# Patient Record
Sex: Female | Born: 1975 | Race: Black or African American | Hispanic: No | Marital: Single | Smoking: Never smoker
Health system: Southern US, Community
[De-identification: ages and names within clinical notes are randomized; demographics above are authoritative.]

## PROBLEM LIST (undated history)

## (undated) DIAGNOSIS — Z86718 Personal history of other venous thrombosis and embolism: Secondary | ICD-10-CM

## (undated) DIAGNOSIS — Z832 Family history of diseases of the blood and blood-forming organs and certain disorders involving the immune mechanism: Secondary | ICD-10-CM

## (undated) DIAGNOSIS — I10 Essential (primary) hypertension: Secondary | ICD-10-CM

## (undated) DIAGNOSIS — M179 Osteoarthritis of knee, unspecified: Secondary | ICD-10-CM

## (undated) DIAGNOSIS — J45909 Unspecified asthma, uncomplicated: Secondary | ICD-10-CM

## (undated) HISTORY — DX: Personal history of other venous thrombosis and embolism: Z86.718

## (undated) HISTORY — DX: Unspecified asthma, uncomplicated: J45.909

## (undated) HISTORY — DX: Essential (primary) hypertension: I10

## (undated) HISTORY — DX: Family history of diseases of the blood and blood-forming organs and certain disorders involving the immune mechanism: Z83.2

## (undated) HISTORY — DX: Osteoarthritis of knee, unspecified: M17.9

---

## 2000-07-24 ENCOUNTER — Encounter: Payer: Self-pay | Admitting: Emergency Medicine

## 2000-07-24 ENCOUNTER — Encounter: Admission: RE | Admit: 2000-07-24 | Discharge: 2000-07-24 | Payer: Self-pay | Admitting: Emergency Medicine

## 2000-08-02 ENCOUNTER — Other Ambulatory Visit: Admission: RE | Admit: 2000-08-02 | Discharge: 2000-08-02 | Payer: Self-pay | Admitting: Gynecology

## 2000-08-13 ENCOUNTER — Inpatient Hospital Stay (HOSPITAL_COMMUNITY): Admission: AD | Admit: 2000-08-13 | Discharge: 2000-08-13 | Payer: Self-pay | Admitting: Gynecology

## 2000-08-14 ENCOUNTER — Ambulatory Visit (HOSPITAL_COMMUNITY): Admission: RE | Admit: 2000-08-14 | Discharge: 2000-08-14 | Payer: Self-pay | Admitting: Gynecology

## 2011-09-13 HISTORY — PX: DILATION AND CURETTAGE OF UTERUS: SHX78

## 2011-09-13 HISTORY — PX: HYSTEROSCOPY: SHX211

## 2014-09-12 HISTORY — PX: OTHER SURGICAL HISTORY: SHX169

## 2015-09-13 DIAGNOSIS — I471 Supraventricular tachycardia, unspecified: Secondary | ICD-10-CM

## 2015-09-13 HISTORY — DX: Supraventricular tachycardia: I47.1

## 2015-09-13 HISTORY — DX: Supraventricular tachycardia, unspecified: I47.10

## 2016-08-11 DIAGNOSIS — I471 Supraventricular tachycardia, unspecified: Secondary | ICD-10-CM | POA: Insufficient documentation

## 2018-07-17 LAB — HM PAP SMEAR: HM Pap smear: NORMAL

## 2021-01-04 DIAGNOSIS — Z832 Family history of diseases of the blood and blood-forming organs and certain disorders involving the immune mechanism: Secondary | ICD-10-CM | POA: Insufficient documentation

## 2021-01-04 DIAGNOSIS — R7303 Prediabetes: Secondary | ICD-10-CM | POA: Insufficient documentation

## 2021-01-04 DIAGNOSIS — Z8616 Personal history of COVID-19: Secondary | ICD-10-CM | POA: Insufficient documentation

## 2021-01-04 DIAGNOSIS — Z86718 Personal history of other venous thrombosis and embolism: Secondary | ICD-10-CM | POA: Insufficient documentation

## 2021-06-22 ENCOUNTER — Telehealth: Payer: Self-pay | Admitting: Internal Medicine

## 2021-06-22 NOTE — Telephone Encounter (Signed)
Paitent asking for short supplyo f PRADAXA until appt on 09/16/21.[facility], [place], [place] [address]. [phone]

## 2021-06-24 NOTE — Telephone Encounter (Signed)
I called and left a detail message on the patient vm that we do not send over any prescription for anyone prior to establishing care. If she need this prescription before her visit she would need to be seen at a Urgent Care or Acute Care.

## 2021-06-24 NOTE — Telephone Encounter (Signed)
This is not my patient has never been seen in this clinic.  I understand she has an upcoming appointment however I do not feel comfortable refilling her medications until I meet her.

## 2021-07-12 ENCOUNTER — Telehealth: Payer: Self-pay | Admitting: Internal Medicine

## 2021-07-12 NOTE — Telephone Encounter (Signed)
Patient called and advised per note below, she verbalized understanding and says he spoke to her old doctor and was told that she has one more refill on that medication, so take it to the pharmacy and see if they will refill with the new insurance. She says they told her to call them back if unable to refill and she says she guess they will figure something out.   June 24, 2021 Donnie Mesa L, CMA     11:02 AM Note I called and left a detail message on the patient vm that we do not send over any prescription for anyone prior to establishing care. If she need this prescription before her visit she would need to be seen at a Urgent Care or Acute Care.     Jearld Fenton, NP to Wilson Singer, CMA     6:09 AM Note This is not my patient has never been seen in this clinic.  I understand she has an upcoming appointment however I do not feel comfortable refilling her medications until I meet her.

## 2021-07-12 NOTE — Telephone Encounter (Signed)
Copied from [place] [phone]. Topic: General - Other >> Jul 12, 2021  1:02 PM Leward Quan A wrote: Reason for CRM: Patient called in to get a refill on her medication but wont be established until her appointment in January needing to know how she can get a refill. Please advise can be reached at Ph#   [phone]

## 2021-09-16 ENCOUNTER — Encounter: Payer: Self-pay | Admitting: Internal Medicine

## 2021-09-16 ENCOUNTER — Other Ambulatory Visit: Payer: Self-pay

## 2021-09-16 ENCOUNTER — Ambulatory Visit: Payer: BC Managed Care – PPO | Admitting: Internal Medicine

## 2021-09-16 VITALS — BP 138/70 | HR 91 | Temp 98.2°F | Resp 17 | Ht 67.0 in | Wt 289.6 lb

## 2021-09-16 DIAGNOSIS — M25562 Pain in left knee: Secondary | ICD-10-CM | POA: Diagnosis not present

## 2021-09-16 DIAGNOSIS — J4540 Moderate persistent asthma, uncomplicated: Secondary | ICD-10-CM

## 2021-09-16 DIAGNOSIS — J45909 Unspecified asthma, uncomplicated: Secondary | ICD-10-CM | POA: Insufficient documentation

## 2021-09-16 DIAGNOSIS — Z86718 Personal history of other venous thrombosis and embolism: Secondary | ICD-10-CM | POA: Insufficient documentation

## 2021-09-16 DIAGNOSIS — G8929 Other chronic pain: Secondary | ICD-10-CM

## 2021-09-16 DIAGNOSIS — I10 Essential (primary) hypertension: Secondary | ICD-10-CM | POA: Diagnosis not present

## 2021-09-16 MED ORDER — DABIGATRAN ETEXILATE MESYLATE 150 MG PO CAPS: 150.0000 mg | ORAL_CAPSULE | Freq: Two times a day (BID) | ORAL | 0 refills | Status: DC

## 2021-09-16 NOTE — Assessment & Plan Note (Signed)
Encourage diet and exercise for weight loss 

## 2021-09-16 NOTE — Progress Notes (Signed)
HPI  Pt presents to the clinic today for to establish care and for management of the conditions listed below.  Asthma: Moderate, persistent. Managed on Ciclesnide and Albuterol. There are no PFT's on file.   HTN: Her BP today is 144/76. She is taking Lisinopril HCT as prescribed. There is no ECG on file.  Hx of Left DVT, Family History of Clotting Disorder: She is on Pradaxa currently. She needs to set up with a hematologist in this area.  She also reports chronic left knee pain. This started 2 years ago. She describes the pain as pinching, weakness. She numbness or tingling of the LLE. The pain seems worse when she is walking on concrete floors. She thinks she may have a had a injury to the left knee in HS but no prior surgery. She takes Tylenol Arthritis with some relief of symptoms. Xray from care everywhere from 05/2020 reviewed.  History reviewed. No pertinent past medical history.  Current Outpatient Medications  Medication Sig Dispense Refill   albuterol (VENTOLIN HFA) 108 (90 Base) MCG/ACT inhaler Shake well and inhale 2 puffs by mouth every 4 hours as needed for shortness of breath, cough or wheezing     ciclesonide (ALVESCO) 80 MCG/ACT inhaler Inhale 1 puff into the lungs 2 (two) times daily.     dabigatran (PRADAXA) 150 MG CAPS capsule Take 150 mg by mouth 2 (two) times daily.     fluticasone (FLONASE) 50 MCG/ACT nasal spray Place 2 sprays into the nose daily.     lisinopril-hydrochlorothiazide (ZESTORETIC) 20-25 MG tablet Take one-half tablet by mouth daily for 1 week. Only increase to 1 tablet daily if blood pressure higher than 140/90.     No current facility-administered medications for this visit.    Allergies  Allergen Reactions   Penicillins Shortness Of Breath    Hives and vomitting    History reviewed. No pertinent family history.  Social History   Socioeconomic History   Marital status: Single    Spouse name: Not on file   Number of children: Not on file    Years of education: Not on file   Highest education level: Not on file  Occupational History   Not on file  Tobacco Use   Smoking status: Never   Smokeless tobacco: Never  Vaping Use   Vaping Use: Never used  Substance and Sexual Activity   Alcohol use: Yes    Alcohol/week: 7.0 standard drinks    Types: 7 Standard drinks or equivalent per week   Drug use: Never   Sexual activity: Not on file  Other Topics Concern   Not on file  Social History Narrative   Not on file   Social Determinants of Health   Financial Resource Strain: Not on file  Food Insecurity: Not on file  Transportation Needs: Not on file  Physical Activity: Not on file  Stress: Not on file  Social Connections: Not on file  Intimate Partner Violence: Not on file    ROS:  Constitutional: Denies fever, malaise, fatigue, headache or abrupt weight changes.  HEENT: Denies eye pain, eye redness, ear pain, ringing in the ears, wax buildup, runny nose, nasal congestion, bloody nose, or sore throat. Respiratory: Denies difficulty breathing, shortness of breath, cough or sputum production.   Cardiovascular: Denies chest pain, chest tightness, palpitations or swelling in the hands or feet.  Gastrointestinal: Denies abdominal pain, bloating, constipation, diarrhea or blood in the stool.  GU: Denies frequency, urgency, pain with urination, blood in urine, odor  or discharge. Musculoskeletal: Pt reports left knee pain. Denies decrease in range of motion, difficulty with gait, muscle pain or joint swelling.  Skin: Denies redness, rashes, lesions or ulcercations.  Neurological: Denies dizziness, difficulty with memory, difficulty with speech or problems with balance and coordination.  Psych: Denies anxiety, depression, SI/HI.  No other specific complaints in a complete review of systems (except as listed in HPI above).  PE:  BP (!) 144/76 (BP Location: Right Arm, Patient Position: Sitting, Cuff Size: Large)    Pulse 91     Temp 98.2 F (36.8 C) (Temporal)    Resp 17    Ht 5\' 7"  (1.702 m)    Wt 289 lb 9.6 oz (131.4 kg)    LMP 08/27/2021    SpO2 100%    BMI 45.36 kg/m  Wt Readings from Last 3 Encounters:  09/16/21 289 lb 9.6 oz (131.4 kg)    General: Appears her stated age, obese, in NAD. HEENT: Head: normal shape and size; Eyes: sclera white and EOMs intact;  Cardiovascular: Normal rate and rhythm. S1,S2 noted.  No murmur, rubs or gallops noted. No JVD or BLE edema. No carotid bruits noted. Pulmonary/Chest: Normal effort and positive vesicular breath sounds. No respiratory distress. No wheezes, rales or ronchi noted.  Musculoskeletal: Normal flexion and extension of left knee.  No pain with palpation of the left knee.  No signs of joint swelling. No difficulty with gait.  Neurological: Alert and oriented. Cranial nerves II-XII grossly intact. Coordination normal.  Psychiatric: Mood and affect normal. Behavior is normal. Judgment and thought content normal.      Assessment and Plan:   Webb Silversmith, NP This visit occurred during the SARS-CoV-2 public health emergency.  Safety protocols were in place, including screening questions prior to the visit, additional usage of staff PPE, and extensive cleaning of exam room while observing appropriate contact time as indicated for disinfecting solutions.

## 2021-09-16 NOTE — Assessment & Plan Note (Signed)
Controlled on Lisinopril HCT Reinforced DASH diet and exercise for weight loss

## 2021-09-16 NOTE — Assessment & Plan Note (Signed)
X-ray reviewed Encourage exercise for weight loss Continue Tylenol arthritis as needed She declines referral to orthopedics at this time

## 2021-09-16 NOTE — Assessment & Plan Note (Signed)
Currently on Pradaxa, refilled today Referral to hematology given family history of clotting disorder

## 2021-09-16 NOTE — Assessment & Plan Note (Signed)
Continue inhalers as previously prescribed She declines referral to asthma and allergy specialist at this time

## 2021-09-16 NOTE — Patient Instructions (Signed)
Knee Exercises Ask your health care provider which exercises are safe for you. Do exercises exactly as told by your health care provider and adjust them as directed. It is normal to feel mild stretching, pulling, tightness, or discomfort as you do these exercises. Stop right away if you feel sudden pain or your pain gets worse. Do not begin these exercises until told by your health care provider. Stretching and range-of-motion exercises These exercises warm up your muscles and joints and improve the movement and flexibility of your knee. These exercises also help to relieve pain and swelling. Knee extension, prone  Lie on your abdomen (prone position) on a bed. Place your left / right knee just beyond the edge of the surface so your knee is not on the bed. You can put a towel under your left / right thigh just above your kneecap for comfort. Relax your leg muscles and allow gravity to straighten your knee (extension). You should feel a stretch behind your left / right knee. Hold this position for __________ seconds. Scoot up so your knee is supported between repetitions. Repeat __________ times. Complete this exercise __________ times a day. Knee flexion, active  Lie on your back with both legs straight. If this causes back discomfort, bend your left / right knee so your foot is flat on the floor. Slowly slide your left / right heel back toward your buttocks. Stop when you feel a gentle stretch in the front of your knee or thigh (flexion). Hold this position for __________ seconds. Slowly slide your left / right heel back to the starting position. Repeat __________ times. Complete this exercise __________ times a day. Quadriceps stretch, prone  Lie on your abdomen on a firm surface, such as a bed or padded floor. Bend your left / right knee and hold your ankle. If you cannot reach your ankle or pant leg, loop a belt around your foot and grab the belt instead. Gently pull your heel toward your  buttocks. Your knee should not slide out to the side. You should feel a stretch in the front of your thigh and knee (quadriceps). Hold this position for __________ seconds. Repeat __________ times. Complete this exercise __________ times a day. Hamstring, supine  Lie on your back (supine position). Loop a belt or towel over the ball of your left / right foot. The ball of your foot is on the walking surface, right under your toes. Straighten your left / right knee and slowly pull on the belt to raise your leg until you feel a gentle stretch behind your knee (hamstring). Do not let your knee bend while you do this. Keep your other leg flat on the floor. Hold this position for __________ seconds. Repeat __________ times. Complete this exercise __________ times a day. Strengthening exercises These exercises build strength and endurance in your knee. Endurance is the ability to use your muscles for a long time, even after they get tired. Quadriceps, isometric This exercise strengthens the muscles in front of your thigh (quadriceps) without moving your knee joint (isometric). Lie on your back with your left / right leg extended and your other knee bent. Put a rolled towel or small pillow under your knee if told by your health care provider. Slowly tense the muscles in the front of your left / right thigh. You should see your kneecap slide up toward your hip or see increased dimpling just above the knee. This motion will push the back of the knee toward the floor.   For __________ seconds, hold the muscle as tight as you can without increasing your pain. Relax the muscles slowly and completely. Repeat __________ times. Complete this exercise __________ times a day. Straight leg raises This exercise strengthens the muscles in front of your thigh (quadriceps) and the muscles that move your hips (hip flexors). Lie on your back with your left / right leg extended and your other knee bent. Tense the  muscles in the front of your left / right thigh. You should see your kneecap slide up or see increased dimpling just above the knee. Your thigh may even shake a bit. Keep these muscles tight as you raise your leg 4-6 inches (10-15 cm) off the floor. Do not let your knee bend. Hold this position for __________ seconds. Keep these muscles tense as you lower your leg. Relax your muscles slowly and completely after each repetition. Repeat __________ times. Complete this exercise __________ times a day. Hamstring, isometric  Lie on your back on a firm surface. Bend your left / right knee about __________ degrees. Dig your left / right heel into the surface as if you are trying to pull it toward your buttocks. Tighten the muscles in the back of your thighs (hamstring) to "dig" as hard as you can without increasing any pain. Hold this position for __________ seconds. Release the tension gradually and allow your muscles to relax completely for __________ seconds after each repetition. Repeat __________ times. Complete this exercise __________ times a day. Hamstring curls If told by your health care provider, do this exercise while wearing ankle weights. Begin with __________lb / kg weights. Then increase the weight by 1 lb (0.5 kg) increments. Do not wear ankle weights that are more than __________lb / kg. Lie on your abdomen with your legs straight. Bend your left / right knee as far as you can without feeling pain. Keep your hips flat against the floor. Hold this position for __________ seconds. Slowly lower your leg to the starting position. Repeat __________ times. Complete this exercise __________ times a day. Squats This exercise strengthens the muscles in front of your thigh and knee (quadriceps). Stand in front of a table, with your feet and knees pointing straight ahead. You may rest your hands on the table for balance but not for support. Slowly bend your knees and lower your hips like you  are going to sit in a chair. Keep your weight over your heels, not over your toes. Keep your lower legs upright so they are parallel with the table legs. Do not let your hips go lower than your knees. Do not bend lower than told by your health care provider. If your knee pain increases, do not bend as low. Hold the squat position for __________ seconds. Slowly push with your legs to return to standing. Do not use your hands to pull yourself to standing. Repeat __________ times. Complete this exercise __________ times a day. Wall slides This exercise strengthens the muscles in front of your thigh and knee (quadriceps). Lean your back against a smooth wall or door, and walk your feet out 18-24 inches (46-61 cm) from it. Place your feet hip-width apart. Slowly slide down the wall or door until your knees bend __________ degrees. Keep your knees over your heels, not over your toes. Keep your knees in line with your hips. Hold this position for __________ seconds. Repeat __________ times. Complete this exercise __________ times a day. Straight leg raises, side-lying This exercise strengthens the muscles that rotate   the leg at the hip and move it away from your body (hip abductors). Lie on your side with your left / right leg in the top position. Lie so your head, shoulder, knee, and hip line up. You may bend your bottom knee to help you keep your balance. Roll your hips slightly forward so your hips are stacked directly over each other and your left / right knee is facing forward. Leading with your heel, lift your top leg 4-6 inches (10-15 cm). You should feel the muscles in your outer hip lifting. Do not let your foot drift forward. Do not let your knee roll toward the ceiling. Hold this position for __________ seconds. Slowly return your leg to the starting position. Let your muscles relax completely after each repetition. Repeat __________ times. Complete this exercise __________ times a  day. Straight leg raises, prone This exercise stretches the muscles that move your hips away from the front of the pelvis (hip extensors). Lie on your abdomen on a firm surface. You can put a pillow under your hips if that is more comfortable. Tense the muscles in your buttocks and lift your left / right leg about 4-6 inches (10-15 cm). Keep your knee straight as you lift your leg. Hold this position for __________ seconds. Slowly lower your leg to the starting position. Let your leg relax completely after each repetition. Repeat __________ times. Complete this exercise __________ times a day. This information is not intended to replace advice given to you by your health care provider. Make sure you discuss any questions you have with your health care provider. Document Revised: 05/11/2021 Document Reviewed: 05/11/2021 Elsevier Patient Education  2022 [employer]  

## 2021-09-30 ENCOUNTER — Encounter: Payer: Self-pay | Admitting: *Deleted

## 2021-10-07 ENCOUNTER — Other Ambulatory Visit: Payer: Self-pay

## 2021-10-07 ENCOUNTER — Inpatient Hospital Stay: Payer: BC Managed Care – PPO

## 2021-10-07 ENCOUNTER — Inpatient Hospital Stay: Payer: BC Managed Care – PPO | Attending: Oncology | Admitting: Oncology

## 2021-10-07 ENCOUNTER — Encounter: Payer: Self-pay | Admitting: Oncology

## 2021-10-07 VITALS — BP 146/97 | HR 79 | Temp 97.9°F | Resp 16 | Wt 280.9 lb

## 2021-10-07 DIAGNOSIS — Z86718 Personal history of other venous thrombosis and embolism: Secondary | ICD-10-CM

## 2021-10-07 DIAGNOSIS — Z7901 Long term (current) use of anticoagulants: Secondary | ICD-10-CM | POA: Insufficient documentation

## 2021-10-07 DIAGNOSIS — Z8249 Family history of ischemic heart disease and other diseases of the circulatory system: Secondary | ICD-10-CM

## 2021-10-07 DIAGNOSIS — Z1211 Encounter for screening for malignant neoplasm of colon: Secondary | ICD-10-CM

## 2021-10-07 LAB — CBC WITH DIFFERENTIAL/PLATELET
Abs Immature Granulocytes: 0.01 10*3/uL (ref 0.00–0.07)
Basophils Absolute: 0.1 10*3/uL (ref 0.0–0.1)
Basophils Relative: 1 %
Eosinophils Absolute: 0.2 10*3/uL (ref 0.0–0.5)
Eosinophils Relative: 3 %
HCT: 40.3 % (ref 36.0–46.0)
Hemoglobin: 12.9 g/dL (ref 12.0–15.0)
Immature Granulocytes: 0 %
Lymphocytes Relative: 33 %
Lymphs Abs: 1.6 10*3/uL (ref 0.7–4.0)
MCH: 24.9 pg — ABNORMAL LOW (ref 26.0–34.0)
MCHC: 32 g/dL (ref 30.0–36.0)
MCV: 77.6 fL — ABNORMAL LOW (ref 80.0–100.0)
Monocytes Absolute: 0.3 10*3/uL (ref 0.1–1.0)
Monocytes Relative: 7 %
Neutro Abs: 2.8 10*3/uL (ref 1.7–7.7)
Neutrophils Relative %: 56 %
Platelets: 233 10*3/uL (ref 150–400)
RBC: 5.19 MIL/uL — ABNORMAL HIGH (ref 3.87–5.11)
RDW: 15.4 % (ref 11.5–15.5)
WBC: 4.9 10*3/uL (ref 4.0–10.5)
nRBC: 0 % (ref 0.0–0.2)

## 2021-10-07 LAB — COMPREHENSIVE METABOLIC PANEL
ALT: 23 U/L (ref 0–44)
AST: 21 U/L (ref 15–41)
Albumin: 4.3 g/dL (ref 3.5–5.0)
Alkaline Phosphatase: 59 U/L (ref 38–126)
Anion gap: 8 (ref 5–15)
BUN: 12 mg/dL (ref 6–20)
CO2: 30 mmol/L (ref 22–32)
Calcium: 9.3 mg/dL (ref 8.9–10.3)
Chloride: 98 mmol/L (ref 98–111)
Creatinine, Ser: 0.83 mg/dL (ref 0.44–1.00)
GFR, Estimated: >60 mL/min (ref >60)
Glucose, Bld: 96 mg/dL (ref 70–99)
Potassium: 3.6 mmol/L (ref 3.5–5.1)
Sodium: 136 mmol/L (ref 135–145)
Total Bilirubin: 0.6 mg/dL (ref 0.3–1.2)
Total Protein: 7.6 g/dL (ref 6.5–8.1)

## 2021-10-07 LAB — HM COLONOSCOPY

## 2021-10-07 MED ORDER — PEG 3350-KCL-NA BICARB-NACL 420 G PO SOLR: 4000.0000 mL | Freq: Once | ORAL | 0 refills | Status: AC

## 2021-10-07 NOTE — Progress Notes (Signed)
Gastroenterology Pre-Procedure Review  Request Date: 11/04/2021 Requesting Physician: Dr. Vicente Males   PATIENT REVIEW QUESTIONS: The patient responded to the following health history questions as indicated:    1. Are you having any GI issues? no 2. Do you have a personal history of Polyps? no 3. Do you have a family history of Colon Cancer or Polyps? no 4. Diabetes Mellitus? no 5. Joint replacements in the past 12 months?no 6. Major health problems in the past 3 months?no 7. Any artificial heart valves, MVP, or defibrillator?no    MEDICATIONS & ALLERGIES:    Patient reports the following regarding taking any anticoagulation/antiplatelet therapy:   Plavix, Coumadin, Eliquis, Xarelto, Lovenox, Pradaxa, Brilinta, or Effient? yes (pradaxa) Aspirin? no  Patient confirms/reports the following medications:  Current Outpatient Medications  Medication Sig Dispense Refill   albuterol (VENTOLIN HFA) 108 (90 Base) MCG/ACT inhaler Shake well and inhale 2 puffs by mouth every 4 hours as needed for shortness of breath, cough or wheezing     ciclesonide (ALVESCO) 80 MCG/ACT inhaler Inhale 1 puff into the lungs 2 (two) times daily.     clindamycin (CLEOCIN) 300 MG capsule Take 300 mg by mouth every 6 (six) hours.     dabigatran (PRADAXA) 150 MG CAPS capsule Take 1 capsule (150 mg total) by mouth 2 (two) times daily. 180 capsule 0   fluticasone (FLONASE) 50 MCG/ACT nasal spray Place 2 sprays into the nose daily.     lisinopril-hydrochlorothiazide (ZESTORETIC) 20-25 MG tablet Take one-half tablet by mouth daily for 1 week. Only increase to 1 tablet daily if blood pressure higher than 140/90.     No current facility-administered medications for this visit.    Patient confirms/reports the following allergies:  Allergies  Allergen Reactions   Penicillins Shortness Of Breath    Hives and vomitting    No orders of the defined types were placed in this encounter.   AUTHORIZATION INFORMATION Primary  Insurance: 1D#: Group #:  Secondary Insurance: 1D#: Group #:  SCHEDULE INFORMATION: Date:11/04/2021  Time: Location:[facility]

## 2021-10-07 NOTE — Progress Notes (Signed)
Pt in for DVT. Reports family history in mother, paternal uncle, and sister.

## 2021-10-07 NOTE — Progress Notes (Signed)
Hematology/Oncology Consult note [phone] [phone]      Patient Care Team: Jearld Fenton, NP as PCP - General (Internal Medicine) Earlie Server, MD as Consulting Physician (Hematology and Oncology)  REFERRING PROVIDER: Jearld Fenton, NP  CHIEF COMPLAINTS/REASON FOR VISIT:  Evaluation of history of DVT  HISTORY OF PRESENTING ILLNESS:   Stacey Townsend is a  46 y.o.  female with PMH listed below was seen in consultation at the request of  Jearld Fenton, NP  for evaluation of history of DVT  12/02/2020, patient presented to urgent care for evaluation of left lower extremity pain.  Was diagnosed with isolated calf DVT involving one of the posterior tibial and peroneal veins.  Patient was started on Lovenox bridged Pradaxa 150 mg twice daily.  Patient tolerates anticoagulation.  There was no immobilization factors prior to her symptom presentation. She had COVID 19 infection in January 2022.  Patient moved to [place] from [place] in July 2022.  She has establish care with primary care provider and was referred to establish care with hematology for management of history of DVT and anticoagulation.  Patient reports significant family history of blood clots, mother, sister, uncle. Patient denies smoking. Family history positive for breast cancer, paternal aunt and maternal grandmother.  Review of Systems  Constitutional:  Negative for appetite change, chills, fatigue and fever.  HENT:   Negative for hearing loss and voice change.   Eyes:  Negative for eye problems.  Respiratory:  Negative for chest tightness and cough.   Cardiovascular:  Negative for chest pain.  Gastrointestinal:  Negative for abdominal distention, abdominal pain and blood in stool.  Endocrine: Negative for hot flashes.  Genitourinary:  Negative for difficulty urinating and frequency.   Musculoskeletal:  Negative for arthralgias.  Skin:  Negative for itching and rash.   Neurological:  Negative for extremity weakness.  Hematological:  Negative for adenopathy.  Psychiatric/Behavioral:  Negative for confusion.    MEDICAL HISTORY:  Past Medical History:  Diagnosis Date   Asthma    Family history of clotting disorder    History of DVT (deep vein thrombosis)    Hypertension    Osteoarthritis, knee    Supraventricular tachycardia, paroxysmal ([place]) 2017    SURGICAL HISTORY: Past Surgical History:  Procedure Laterality Date   DILATION AND CURETTAGE OF UTERUS  2013   endometerial ablation  2016   HYSTEROSCOPY  2013    SOCIAL HISTORY: Social History   Socioeconomic History   Marital status: Single    Spouse name: Not on file   Number of children: Not on file   Years of education: Not on file   Highest education level: Not on file  Occupational History   Not on file  Tobacco Use   Smoking status: Never   Smokeless tobacco: Never  Vaping Use   Vaping Use: Former   Devices: only 2-3 months  Substance and Sexual Activity   Alcohol use: Yes    Alcohol/week: 7.0 standard drinks    Types: 7 Standard drinks or equivalent per week   Drug use: Never   Sexual activity: Yes  Other Topics Concern   Not on file  Social History Narrative   Not on file   Social Determinants of Health   Financial Resource Strain: Not on file  Food Insecurity: Not on file  Transportation Needs: Not on file  Physical Activity: Not on file  Stress: Not on file  Social Connections: Not on file  Intimate Partner  Violence: Not on file    FAMILY HISTORY: Family History  Problem Relation Age of Onset   Deep vein thrombosis Mother    Thyroid disease Mother    Hypertension Father    Deep vein thrombosis Sister    Hypertension Sister    Thyroid disease Sister    Deep vein thrombosis Maternal Uncle    Deep vein thrombosis Paternal Aunt    Breast cancer Paternal Aunt    Deep vein thrombosis Paternal Uncle    Breast cancer Maternal Grandmother     ALLERGIES:   is allergic to penicillins.  MEDICATIONS:  Current Outpatient Medications  Medication Sig Dispense Refill   albuterol (VENTOLIN HFA) 108 (90 Base) MCG/ACT inhaler Shake well and inhale 2 puffs by mouth every 4 hours as needed for shortness of breath, cough or wheezing     ciclesonide (ALVESCO) 80 MCG/ACT inhaler Inhale 1 puff into the lungs 2 (two) times daily.     dabigatran (PRADAXA) 150 MG CAPS capsule Take 1 capsule (150 mg total) by mouth 2 (two) times daily. 180 capsule 0   fluticasone (FLONASE) 50 MCG/ACT nasal spray Place 2 sprays into the nose daily.     lisinopril-hydrochlorothiazide (ZESTORETIC) 20-25 MG tablet Take one-half tablet by mouth daily for 1 week. Only increase to 1 tablet daily if blood pressure higher than 140/90.     clindamycin (CLEOCIN) 300 MG capsule Take 300 mg by mouth every 6 (six) hours.     polyethylene glycol-electrolytes (NULYTELY) 420 g solution Take 4,000 mLs by mouth once for 1 dose. 4000 mL 0   No current facility-administered medications for this visit.     PHYSICAL EXAMINATION: ECOG PERFORMANCE STATUS: 0 - Asymptomatic Vitals:   10/07/21 0935  BP: (!) 146/97  Pulse: 79  Resp: 16  Temp: 97.9 F (36.6 C)  SpO2: 100%   Filed Weights   10/07/21 0935  Weight: 280 lb 14.4 oz (127.4 kg)    Physical Exam Constitutional:      General: She is not in acute distress. HENT:     Head: Normocephalic and atraumatic.  Eyes:     General: No scleral icterus. Cardiovascular:     Rate and Rhythm: Normal rate and regular rhythm.     Heart sounds: Normal heart sounds.  Pulmonary:     Effort: Pulmonary effort is normal. No respiratory distress.     Breath sounds: No wheezing.  Abdominal:     General: Bowel sounds are normal. There is no distension.     Palpations: Abdomen is soft.  Musculoskeletal:        General: No deformity. Normal range of motion.     Cervical back: Normal range of motion and neck supple.  Skin:    General: Skin is warm and  dry.     Findings: No erythema or rash.  Neurological:     Mental Status: She is alert and oriented to person, place, and time. Mental status is at baseline.     Cranial Nerves: No cranial nerve deficit.     Coordination: Coordination normal.  Psychiatric:        Mood and Affect: Mood normal.    LABORATORY DATA:  I have reviewed the data as listed Lab Results  Component Value Date   WBC 4.9 10/07/2021   HGB 12.9 10/07/2021   HCT 40.3 10/07/2021   MCV 77.6 (L) 10/07/2021   PLT 233 10/07/2021   Recent Labs    10/07/21 1046  NA 136  K 3.6  CL 98  CO2 30  GLUCOSE 96  BUN 12  CREATININE 0.83  CALCIUM 9.3  GFRNONAA >60  PROT 7.6  ALBUMIN 4.3  AST 21  ALT 23  ALKPHOS 59  BILITOT 0.6   Iron/TIBC/Ferritin/ %Sat No results found for: IRON, TIBC, FERRITIN, IRONPCTSAT    RADIOGRAPHIC STUDIES: I have personally reviewed the radiological images as listed and agreed with the findings in the report. No results found.    ASSESSMENT & PLAN:  1. History of DVT (deep vein thrombosis)   2. Family history of blood clots   3. Colon cancer screening    #Unprovoked DVT Patient has been on anticoagulation with Pradaxa since March 2022. Left lower extremity symptom has completely resolved. Repeat ultrasound left lower extremity for reassessment. Consider to switch to low-dose Eliquis for prophylaxis long-term. #Family history of blood clots check hypercoagulable work-up.  #Age-appropriate cancer screening, patient has family history of breast cancer.  Annual mammogram.  Consider genetic testing. Refer to gastroenterology for screening colonoscopy . Orders Placed This Encounter  Procedures   US Venous Img Lower Unilateral Left    Standing Status:   Future    Standing Expiration Date:   10/07/2022    Order Specific Question:   Reason for Exam (SYMPTOM  OR DIAGNOSIS REQUIRED)    Answer:   History of DVT    Order Specific Question:   Preferred imaging location?    Answer:    [facility]   Comprehensive metabolic panel    Standing Status:   Future    Number of Occurrences:   1    Standing Expiration Date:   10/07/2022   CBC with Differential/Platelet    Standing Status:   Future    Number of Occurrences:   1    Standing Expiration Date:   10/07/2022   ANTIPHOSPHOLIPID SYNDROME PROF    Standing Status:   Future    Number of Occurrences:   1    Standing Expiration Date:   10/07/2022   Prothrombin gene mutation    Standing Status:   Future    Number of Occurrences:   1    Standing Expiration Date:   10/07/2022   Factor 5 leiden    Standing Status:   Future    Number of Occurrences:   1    Standing Expiration Date:   10/07/2022   Ambulatory referral to Gastroenterology    Referral Priority:   Routine    Referral Type:   Consultation    Referral Reason:   Specialty Services Required    Referred to Provider:   Jonathon Bellows, MD    Number of Visits Requested:   1    All questions were answered. The patient knows to call the clinic with any problems questions or concerns.  cc Jearld Fenton, NP    Return of visit: 3 weeks Thank you for this kind referral and the opportunity to participate in the care of this patient. A copy of today's note is routed to referring provider   Earlie Server, MD, PhD [facility] Hematology Oncology 10/07/2021

## 2021-10-11 LAB — FACTOR 5 LEIDEN

## 2021-10-12 ENCOUNTER — Telehealth: Payer: Self-pay

## 2021-10-12 LAB — PROTHROMBIN GENE MUTATION

## 2021-10-12 NOTE — Telephone Encounter (Signed)
Refaxed blood thinner clearance

## 2021-10-13 ENCOUNTER — Telehealth: Payer: Self-pay

## 2021-10-13 NOTE — Telephone Encounter (Signed)
Called patient about her blood thinner she can stop her pradaxa 1 day before and restart one day after per dr Garnette Gunner patient understands

## 2021-10-14 ENCOUNTER — Telehealth: Payer: Self-pay | Admitting: Oncology

## 2021-10-14 ENCOUNTER — Ambulatory Visit: Payer: BC Managed Care – PPO

## 2021-10-14 NOTE — Telephone Encounter (Signed)
Pt called and left VM regarding questions about [place]. I have attempted to call pt back, it is going directly to VM. I have left 2 Vm's. Also attempted to call work number and it asks for an extension. Unable to get in touch with pt.

## 2021-10-15 LAB — DRVVT CONFIRM: dRVVT Confirm: >1.9 ratio — ABNORMAL HIGH (ref 0.8–1.2)

## 2021-10-15 LAB — ANTIPHOSPHOLIPID SYNDROME PROF
Anticardiolipin IgG: 9 GPL U/mL (ref 0–14)
Anticardiolipin IgM: 9 MPL U/mL (ref 0–12)
DRVVT: >180 s — ABNORMAL HIGH (ref 0.0–47.0)
PTT Lupus Anticoagulant: 58.2 s — ABNORMAL HIGH (ref 0.0–51.9)

## 2021-10-15 LAB — DRVVT MIX: dRVVT Mix: 113.3 s — ABNORMAL HIGH (ref 0.0–40.4)

## 2021-10-15 LAB — HEXAGONAL PHASE PHOSPHOLIPID: Hexagonal Phase Phospholipid: 11 s (ref 0–11)

## 2021-10-15 LAB — PTT-LA MIX: PTT-LA Mix: 50.3 s — ABNORMAL HIGH (ref 0.0–48.9)

## 2021-10-25 ENCOUNTER — Other Ambulatory Visit: Payer: Self-pay

## 2021-10-25 ENCOUNTER — Ambulatory Visit
Admission: RE | Admit: 2021-10-25 | Discharge: 2021-10-25 | Disposition: A | Discharge status: Home / Self Care | Payer: BC Managed Care – PPO | Source: Ambulatory Visit | Attending: Oncology | Admitting: Oncology

## 2021-10-25 DIAGNOSIS — Z86718 Personal history of other venous thrombosis and embolism: Secondary | ICD-10-CM | POA: Diagnosis present

## 2021-10-28 ENCOUNTER — Encounter: Payer: Self-pay | Admitting: Oncology

## 2021-10-28 ENCOUNTER — Other Ambulatory Visit: Payer: Self-pay

## 2021-10-28 ENCOUNTER — Inpatient Hospital Stay: Payer: BC Managed Care – PPO | Attending: Oncology | Admitting: Oncology

## 2021-10-28 ENCOUNTER — Ambulatory Visit (INDEPENDENT_AMBULATORY_CARE_PROVIDER_SITE_OTHER): Payer: BC Managed Care – PPO | Admitting: Internal Medicine

## 2021-10-28 ENCOUNTER — Encounter: Payer: Self-pay | Admitting: Internal Medicine

## 2021-10-28 VITALS — BP 138/73 | HR 63 | Temp 97.5°F | Ht 67.5 in | Wt 282.0 lb

## 2021-10-28 VITALS — BP 148/92 | HR 62 | Temp 98.2°F | Wt 282.0 lb

## 2021-10-28 DIAGNOSIS — Z8349 Family history of other endocrine, nutritional and metabolic diseases: Secondary | ICD-10-CM | POA: Diagnosis not present

## 2021-10-28 DIAGNOSIS — Z803 Family history of malignant neoplasm of breast: Secondary | ICD-10-CM | POA: Insufficient documentation

## 2021-10-28 DIAGNOSIS — Z114 Encounter for screening for human immunodeficiency virus [HIV]: Secondary | ICD-10-CM

## 2021-10-28 DIAGNOSIS — Z1231 Encounter for screening mammogram for malignant neoplasm of breast: Secondary | ICD-10-CM | POA: Diagnosis not present

## 2021-10-28 DIAGNOSIS — Z833 Family history of diabetes mellitus: Secondary | ICD-10-CM | POA: Insufficient documentation

## 2021-10-28 DIAGNOSIS — Z1159 Encounter for screening for other viral diseases: Secondary | ICD-10-CM

## 2021-10-28 DIAGNOSIS — Z8249 Family history of ischemic heart disease and other diseases of the circulatory system: Secondary | ICD-10-CM | POA: Diagnosis not present

## 2021-10-28 DIAGNOSIS — Z0001 Encounter for general adult medical examination with abnormal findings: Secondary | ICD-10-CM

## 2021-10-28 DIAGNOSIS — R760 Raised antibody titer: Secondary | ICD-10-CM

## 2021-10-28 DIAGNOSIS — Z7289 Other problems related to lifestyle: Secondary | ICD-10-CM | POA: Diagnosis not present

## 2021-10-28 DIAGNOSIS — I10 Essential (primary) hypertension: Secondary | ICD-10-CM | POA: Diagnosis not present

## 2021-10-28 DIAGNOSIS — Z86718 Personal history of other venous thrombosis and embolism: Secondary | ICD-10-CM | POA: Diagnosis not present

## 2021-10-28 DIAGNOSIS — Z87891 Personal history of nicotine dependence: Secondary | ICD-10-CM | POA: Insufficient documentation

## 2021-10-28 DIAGNOSIS — Z79899 Other long term (current) drug therapy: Secondary | ICD-10-CM | POA: Diagnosis not present

## 2021-10-28 DIAGNOSIS — D6862 Lupus anticoagulant syndrome: Secondary | ICD-10-CM | POA: Diagnosis not present

## 2021-10-28 DIAGNOSIS — E782 Mixed hyperlipidemia: Secondary | ICD-10-CM

## 2021-10-28 NOTE — Progress Notes (Signed)
Hematology/Oncology Progress note [phone] [phone]         Patient Care Team: Jearld Fenton, NP as PCP - General (Internal Medicine) Earlie Server, MD as Consulting Physician (Hematology and Oncology)  REFERRING PROVIDER: Jearld Fenton, NP  CHIEF COMPLAINTS/REASON FOR VISIT:   history of DVT  HISTORY OF PRESENTING ILLNESS:   Stacey Townsend is a  46 y.o.  female with PMH listed below was seen in consultation at the request of  Jearld Fenton, NP  for evaluation of history of DVT  12/02/2020, patient presented to urgent care for evaluation of left lower extremity pain.  Was diagnosed with isolated calf DVT involving one of the posterior tibial and peroneal veins.  Patient was started on Lovenox bridged Pradaxa 150 mg twice daily.  Patient tolerates anticoagulation.  There was no immobilization factors prior to her symptom presentation. She had COVID 19 infection in January 2022.  Patient moved to [place] from [place] in July 2022.  She has establish care with primary care provider and was referred to establish care with hematology for management of history of DVT and anticoagulation.  Patient reports significant family history of blood clots, mother, sister, uncle. Patient denies smoking. Family history positive for breast cancer, paternal aunt and maternal grandmother.  INTERVAL HISTORY Stacey Townsend is a 46 y.o. female who has above history reviewed by me today presents for follow up visit for management of history of DVT She had blood work done during the interval.  Review of Systems  Constitutional:  Negative for appetite change, chills, fatigue and fever.  HENT:   Negative for hearing loss and voice change.   Eyes:  Negative for eye problems.  Respiratory:  Negative for chest tightness and cough.   Cardiovascular:  Negative for chest pain.  Gastrointestinal:  Negative for abdominal distention, abdominal pain and blood in stool.   Endocrine: Negative for hot flashes.  Genitourinary:  Negative for difficulty urinating and frequency.   Musculoskeletal:  Negative for arthralgias.  Skin:  Negative for itching and rash.  Neurological:  Negative for extremity weakness.  Hematological:  Negative for adenopathy.  Psychiatric/Behavioral:  Negative for confusion.    MEDICAL HISTORY:  Past Medical History:  Diagnosis Date   Asthma    Family history of clotting disorder    History of DVT (deep vein thrombosis)    Hypertension    Osteoarthritis, knee    Supraventricular tachycardia, paroxysmal ([place]) 2017    SURGICAL HISTORY: Past Surgical History:  Procedure Laterality Date   DILATION AND CURETTAGE OF UTERUS  2013   endometerial ablation  2016   HYSTEROSCOPY  2013    SOCIAL HISTORY: Social History   Socioeconomic History   Marital status: Single    Spouse name: Not on file   Number of children: Not on file   Years of education: Not on file   Highest education level: Not on file  Occupational History   Not on file  Tobacco Use   Smoking status: Never   Smokeless tobacco: Never  Vaping Use   Vaping Use: Former   Devices: only 2-3 months  Substance and Sexual Activity   Alcohol use: Yes    Alcohol/week: 7.0 standard drinks    Types: 7 Standard drinks or equivalent per week   Drug use: Never   Sexual activity: Yes  Other Topics Concern   Not on file  Social History Narrative   Not on file   Social Determinants of Health  Financial Resource Strain: Not on file  Food Insecurity: Not on file  Transportation Needs: Not on file  Physical Activity: Not on file  Stress: Not on file  Social Connections: Not on file  Intimate Partner Violence: Not on file    FAMILY HISTORY: Family History  Problem Relation Age of Onset   Deep vein thrombosis Mother    Thyroid disease Mother    Hypertension Father    Diabetes Father    Deep vein thrombosis Sister    Thyroid disease Sister    Hypertension  Sister    Healthy Sister    Breast cancer Paternal Grandmother    Deep vein thrombosis Maternal Uncle    Deep vein thrombosis Paternal Aunt    Breast cancer Paternal Aunt    Deep vein thrombosis Paternal Uncle     ALLERGIES:  is allergic to penicillins.  MEDICATIONS:  Current Outpatient Medications  Medication Sig Dispense Refill   albuterol (VENTOLIN HFA) 108 (90 Base) MCG/ACT inhaler Shake well and inhale 2 puffs by mouth every 4 hours as needed for shortness of breath, cough or wheezing     cetirizine (ZYRTEC) 10 MG tablet Take 10 mg by mouth daily.     ciclesonide (ALVESCO) 80 MCG/ACT inhaler Inhale 1 puff into the lungs 2 (two) times daily.     dabigatran (PRADAXA) 150 MG CAPS capsule Take 1 capsule (150 mg total) by mouth 2 (two) times daily. 180 capsule 0   lisinopril-hydrochlorothiazide (ZESTORETIC) 20-25 MG tablet Take one-half tablet by mouth daily for 1 week. Only increase to 1 tablet daily if blood pressure higher than 140/90.     No current facility-administered medications for this visit.     PHYSICAL EXAMINATION: ECOG PERFORMANCE STATUS: 0 - Asymptomatic Vitals:   10/28/21 1406  BP: (!) 148/92  Pulse: 62  Temp: 98.2 F (36.8 C)   Filed Weights   10/28/21 1406  Weight: 282 lb (127.9 kg)    Physical Exam Constitutional:      General: She is not in acute distress. HENT:     Head: Normocephalic and atraumatic.  Eyes:     General: No scleral icterus. Cardiovascular:     Rate and Rhythm: Normal rate and regular rhythm.     Heart sounds: Normal heart sounds.  Pulmonary:     Effort: Pulmonary effort is normal. No respiratory distress.     Breath sounds: No wheezing.  Abdominal:     General: Bowel sounds are normal. There is no distension.     Palpations: Abdomen is soft.  Musculoskeletal:        General: No deformity. Normal range of motion.     Cervical back: Normal range of motion and neck supple.  Skin:    General: Skin is warm and dry.      Findings: No erythema or rash.  Neurological:     Mental Status: She is alert and oriented to person, place, and time. Mental status is at baseline.     Cranial Nerves: No cranial nerve deficit.     Coordination: Coordination normal.  Psychiatric:        Mood and Affect: Mood normal.    LABORATORY DATA:  I have reviewed the data as listed Lab Results  Component Value Date   WBC 4.9 10/07/2021   HGB 12.9 10/07/2021   HCT 40.3 10/07/2021   MCV 77.6 (L) 10/07/2021   PLT 233 10/07/2021   Recent Labs    10/07/21 1046  NA 136  K 3.6  CL 98  CO2 30  GLUCOSE 96  BUN 12  CREATININE 0.83  CALCIUM 9.3  GFRNONAA >60  PROT 7.6  ALBUMIN 4.3  AST 21  ALT 23  ALKPHOS 59  BILITOT 0.6    Iron/TIBC/Ferritin/ %Sat No results found for: IRON, TIBC, FERRITIN, IRONPCTSAT    RADIOGRAPHIC STUDIES: I have personally reviewed the radiological images as listed and agreed with the findings in the report. US Venous Img Lower Unilateral Left  Result Date: 10/25/2021 CLINICAL DATA:  History of prior DVT EXAM: LEFT LOWER EXTREMITY VENOUS DOPPLER ULTRASOUND TECHNIQUE: Gray-scale sonography with graded compression, as well as color Doppler and duplex ultrasound were performed to evaluate the lower extremity deep venous systems from the level of the common femoral vein and including the common femoral, femoral, profunda femoral, popliteal and calf veins including the posterior tibial, peroneal and gastrocnemius veins when visible. The superficial great saphenous vein was also interrogated. Spectral Doppler was utilized to evaluate flow at rest and with distal augmentation maneuvers in the common femoral, femoral and popliteal veins. COMPARISON:  None. FINDINGS: Contralateral Common Femoral Vein: Respiratory phasicity is normal and symmetric with the symptomatic side. No evidence of thrombus. Normal compressibility. Common Femoral Vein: No evidence of thrombus. Normal compressibility, respiratory phasicity  and response to augmentation. Saphenofemoral Junction: No evidence of thrombus. Normal compressibility and flow on color Doppler imaging. Profunda Femoral Vein: No evidence of thrombus. Normal compressibility and flow on color Doppler imaging. Femoral Vein: No evidence of thrombus. Normal compressibility, respiratory phasicity and response to augmentation. Popliteal Vein: No evidence of thrombus. Normal compressibility, respiratory phasicity and response to augmentation. Calf Veins: No evidence of thrombus. Normal compressibility and flow on color Doppler imaging. IMPRESSION: No evidence of deep venous thrombosis. Electronically Signed   By: Jerilynn Mages.  Shick M.D.   On: 10/25/2021 17:51      ASSESSMENT & PLAN:  1. Lupus anticoagulant positive   2. History of DVT (deep vein thrombosis)   3. Family history of blood clots    #Unprovoked DVT Patient has been on anticoagulation with Pradaxa since March 2022. Blood work was reviewed with patient  Negative Factor 5 leiden mutation, negative prothrombin gene mutation.  Negative anticardiolipin IgG/IgM.  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  We discussed about recommendation of switching to coumadin. Discussed about potential less efficacy of DOACs in APS.  Patient prefers to be kept on Pradaxa until confirmation is done in 12 weeks. Pradaxa has been effective so far and I think it is reasonable to continue for now. She has colonoscopy next week and will hold pradaxa for 2 days prior to procedure. I will repeat lupus anticoagulant when she is off pradaxa.    Orders Placed This Encounter  Procedures   Lupus anticoagulant panel    Standing Status:   Future    Standing Expiration Date:   10/28/2022    All questions were answered. The patient knows to call the clinic with any problems questions or concerns.  cc Jearld Fenton, NP    Return of visit: 3 months.   Earlie Server, MD, PhD [facility] Hematology Oncology 10/28/2021

## 2021-10-28 NOTE — Progress Notes (Signed)
Subjective:    Patient ID: Stacey Townsend, female    DOB: 1976/05/06, 46 y.o.   MRN: [id number]  HPI  Patient presents to clinic today for her annual exam.  Flu: 07/2013 Tetanus: 12/2020 COVID: Never Pap smear: 07/2018 Mammogram: 09/2019 Colon screening: scheduled next week Vision screening: as needed Dentist: biannually  Diet: She does eat meat. She consumes fruits and veggies. She does eat some fried foods. She drinks mostly juice, soda and water. Exercise: None  Review of Systems     Past Medical History:  Diagnosis Date   Asthma    Family history of clotting disorder    History of DVT (deep vein thrombosis)    Hypertension    Osteoarthritis, knee    Supraventricular tachycardia, paroxysmal ([facility]) 2017    Current Outpatient Medications  Medication Sig Dispense Refill   albuterol (VENTOLIN HFA) 108 (90 Base) MCG/ACT inhaler Shake well and inhale 2 puffs by mouth every 4 hours as needed for shortness of breath, cough or wheezing     ciclesonide (ALVESCO) 80 MCG/ACT inhaler Inhale 1 puff into the lungs 2 (two) times daily.     clindamycin (CLEOCIN) 300 MG capsule Take 300 mg by mouth every 6 (six) hours.     dabigatran (PRADAXA) 150 MG CAPS capsule Take 1 capsule (150 mg total) by mouth 2 (two) times daily. 180 capsule 0   fluticasone (FLONASE) 50 MCG/ACT nasal spray Place 2 sprays into the nose daily.     lisinopril-hydrochlorothiazide (ZESTORETIC) 20-25 MG tablet Take one-half tablet by mouth daily for 1 week. Only increase to 1 tablet daily if blood pressure higher than 140/90.     No current facility-administered medications for this visit.    Allergies  Allergen Reactions   Penicillins Shortness Of Breath    Hives and vomitting    Family History  Problem Relation Age of Onset   Deep vein thrombosis Mother    Thyroid disease Mother    Hypertension Father    Deep vein thrombosis Sister    Hypertension Sister    Thyroid disease Sister    Deep vein  thrombosis Maternal Uncle    Deep vein thrombosis Paternal Aunt    Breast cancer Paternal Aunt    Deep vein thrombosis Paternal Uncle    Breast cancer Maternal Grandmother     Social History   Socioeconomic History   Marital status: Single    Spouse name: Not on file   Number of children: Not on file   Years of education: Not on file   Highest education level: Not on file  Occupational History   Not on file  Tobacco Use   Smoking status: Never   Smokeless tobacco: Never  Vaping Use   Vaping Use: Former   Devices: only 2-3 months  Substance and Sexual Activity   Alcohol use: Yes    Alcohol/week: 7.0 standard drinks    Types: 7 Standard drinks or equivalent per week   Drug use: Never   Sexual activity: Yes  Other Topics Concern   Not on file  Social History Narrative   Not on file   Social Determinants of Health   Financial Resource Strain: Not on file  Food Insecurity: Not on file  Transportation Needs: Not on file  Physical Activity: Not on file  Stress: Not on file  Social Connections: Not on file  Intimate Partner Violence: Not on file     Constitutional: Denies fever, malaise, fatigue, headache or abrupt weight changes.  HEENT: Denies eye pain, eye redness, ear pain, ringing in the ears, wax buildup, runny nose, nasal congestion, bloody nose, or sore throat. Respiratory: Denies difficulty breathing, shortness of breath, cough or sputum production.   Cardiovascular: Denies chest pain, chest tightness, palpitations or swelling in the hands or feet.  Gastrointestinal: Denies abdominal pain, bloating, constipation, diarrhea or blood in the stool.  GU: Denies urgency, frequency, pain with urination, burning sensation, blood in urine, odor or discharge. Musculoskeletal: Patient reports left knee pain.  Denies decrease in range of motion, difficulty with gait, muscle pain or joint swelling.  Skin: Denies redness, rashes, lesions or ulcercations.  Neurological: Denies  dizziness, difficulty with memory, difficulty with speech or problems with balance and coordination.  Psych: Denies anxiety, depression, SI/HI.  No other specific complaints in a complete review of systems (except as listed in HPI above).  Objective:   Physical Exam  BP 138/73 (BP Location: Right Arm, Patient Position: Sitting, Cuff Size: Large)    Pulse 63    Temp (!) 97.5 F (36.4 C) (Temporal)    Ht 5' 7.5" (1.715 m)    Wt 282 lb (127.9 kg)    SpO2 99%    BMI 43.52 kg/m   Wt Readings from Last 3 Encounters:  10/07/21 280 lb 14.4 oz (127.4 kg)  09/16/21 289 lb 9.6 oz (131.4 kg)    General: Appears her stated age, obese, in NAD. Skin: Warm, dry and intact. No rashes noted. HEENT: Head: normal shape and size; Eyes: sclera white and EOMs intact;  Neck:  Neck supple, trachea midline. No masses, lumps or thyromegaly present.  Cardiovascular: Normal rate and rhythm. S1,S2 noted.  No murmur, rubs or gallops noted.  Trace BLE edema. No carotid bruits noted. Pulmonary/Chest: Normal effort and positive vesicular breath sounds. No respiratory distress. No wheezes, rales or ronchi noted.  Abdomen: Soft and nontender. Normal bowel sounds.  Musculoskeletal: Strength 5/5 BUE/BLE.  No difficulty with gait.  Neurological: Alert and oriented. Cranial nerves II-XII grossly intact. Coordination normal.  Psychiatric: Mood and affect normal. Behavior is normal. Judgment and thought content normal.    BMET    Component Value Date/Time   NA 136 10/07/2021 1046   K 3.6 10/07/2021 1046   CL 98 10/07/2021 1046   CO2 30 10/07/2021 1046   GLUCOSE 96 10/07/2021 1046   BUN 12 10/07/2021 1046   CREATININE 0.83 10/07/2021 1046   CALCIUM 9.3 10/07/2021 1046   GFRNONAA >60 10/07/2021 1046    Lipid Panel  No results found for: CHOL, TRIG, HDL, CHOLHDL, VLDL, LDLCALC  CBC    Component Value Date/Time   WBC 4.9 10/07/2021 1046   RBC 5.19 (H) 10/07/2021 1046   HGB 12.9 10/07/2021 1046   HCT 40.3  10/07/2021 1046   PLT 233 10/07/2021 1046   MCV 77.6 (L) 10/07/2021 1046   MCH 24.9 (L) 10/07/2021 1046   MCHC 32.0 10/07/2021 1046   RDW 15.4 10/07/2021 1046   LYMPHSABS 1.6 10/07/2021 1046   MONOABS 0.3 10/07/2021 1046   EOSABS 0.2 10/07/2021 1046   BASOSABS 0.1 10/07/2021 1046    Hgb A1C No results found for: HGBA1C         Assessment & Plan:   Preventative Health Maintenance:  She declines flu shot Tetanus UTD Encouraged her to get her COVID-vaccine Pap smear due 2024 Mammogram ordered-she will call to schedule Colon screening scheduled for next week Encouraged her to consume a balanced diet and exercise regimen Advised her to see  an eye doctor and dentist annually CBC and c-Met reviewed.  Will check lipid, A1c, HIV and hep C today  RTC in 6 months, follow-up chronic conditions Webb Silversmith, NP This visit occurred during the SARS-CoV-2 public health emergency.  Safety protocols were in place, including screening questions prior to the visit, additional usage of staff PPE, and extensive cleaning of exam room while observing appropriate contact time as indicated for disinfecting solutions.

## 2021-10-28 NOTE — Patient Instructions (Signed)

## 2021-10-28 NOTE — Assessment & Plan Note (Signed)
Encourage diet and exercise for weight loss 

## 2021-10-29 LAB — LIPID PANEL
Cholesterol: 222 mg/dL — ABNORMAL HIGH (ref <200)
HDL: 56 mg/dL (ref >=50)
LDL Cholesterol (Calc): 146 mg/dL (calc) — ABNORMAL HIGH
Non-HDL Cholesterol (Calc): 166 mg/dL (calc) — ABNORMAL HIGH (ref <130)
Total CHOL/HDL Ratio: 4 (calc) (ref <5.0)
Triglycerides: 90 mg/dL (ref <150)

## 2021-10-29 LAB — TEST AUTHORIZATION: TEST CODE:: 7600

## 2021-10-29 LAB — HEMOGLOBIN A1C
Hgb A1c MFr Bld: 5.9 % of total Hgb — ABNORMAL HIGH (ref <5.7)
Mean Plasma Glucose: 123 mg/dL
eAG (mmol/L): 6.8 mmol/L

## 2021-10-29 LAB — HEPATITIS C ANTIBODY
Hepatitis C Ab: NONREACTIVE
SIGNAL TO CUT-OFF: 0.08 (ref <1.00)

## 2021-10-29 LAB — HIV ANTIBODY (ROUTINE TESTING W REFLEX): HIV 1&2 Ab, 4th Generation: NONREACTIVE

## 2021-11-01 MED ORDER — LOVASTATIN 10 MG PO TABS: 10.0000 mg | ORAL_TABLET | Freq: Every day | ORAL | 2 refills | Status: DC

## 2021-11-01 NOTE — Addendum Note (Signed)
Addended by: Jearld Fenton on: 11/01/2021 07:46 AM   Modules accepted: Orders

## 2021-11-04 ENCOUNTER — Encounter: Payer: Self-pay | Admitting: Gastroenterology

## 2021-11-04 ENCOUNTER — Ambulatory Visit
Admission: RE | Admit: 2021-11-04 | Discharge: 2021-11-04 | Disposition: A | Discharge status: Home / Self Care | Payer: BC Managed Care – PPO | Attending: Gastroenterology | Admitting: Gastroenterology

## 2021-11-04 ENCOUNTER — Encounter
Admission: RE | Disposition: A | Discharge status: Home / Self Care | Payer: Self-pay | Source: Home / Self Care | Attending: Gastroenterology

## 2021-11-04 ENCOUNTER — Ambulatory Visit: Payer: BC Managed Care – PPO | Admitting: Anesthesiology

## 2021-11-04 ENCOUNTER — Inpatient Hospital Stay: Payer: BC Managed Care – PPO

## 2021-11-04 DIAGNOSIS — I10 Essential (primary) hypertension: Secondary | ICD-10-CM | POA: Diagnosis not present

## 2021-11-04 DIAGNOSIS — Z6841 Body Mass Index (BMI) 40.0 and over, adult: Secondary | ICD-10-CM | POA: Diagnosis not present

## 2021-11-04 DIAGNOSIS — K635 Polyp of colon: Secondary | ICD-10-CM | POA: Diagnosis not present

## 2021-11-04 DIAGNOSIS — D124 Benign neoplasm of descending colon: Secondary | ICD-10-CM | POA: Diagnosis not present

## 2021-11-04 DIAGNOSIS — Z86718 Personal history of other venous thrombosis and embolism: Secondary | ICD-10-CM

## 2021-11-04 DIAGNOSIS — D6862 Lupus anticoagulant syndrome: Secondary | ICD-10-CM | POA: Insufficient documentation

## 2021-11-04 DIAGNOSIS — Z1211 Encounter for screening for malignant neoplasm of colon: Secondary | ICD-10-CM | POA: Insufficient documentation

## 2021-11-04 DIAGNOSIS — J45909 Unspecified asthma, uncomplicated: Secondary | ICD-10-CM | POA: Diagnosis not present

## 2021-11-04 DIAGNOSIS — Z7901 Long term (current) use of anticoagulants: Secondary | ICD-10-CM | POA: Insufficient documentation

## 2021-11-04 DIAGNOSIS — R760 Raised antibody titer: Secondary | ICD-10-CM

## 2021-11-04 HISTORY — PX: COLONOSCOPY WITH PROPOFOL: SHX5780

## 2021-11-04 LAB — POCT PREGNANCY, URINE: Preg Test, Ur: NEGATIVE

## 2021-11-04 SURGERY — COLONOSCOPY WITH PROPOFOL
Anesthesia: General

## 2021-11-04 MED ORDER — DEXMEDETOMIDINE (PRECEDEX) IN NS 20 MCG/5ML (4 MCG/ML) IV SYRINGE
PREFILLED_SYRINGE | INTRAVENOUS | Status: DC | PRN
  Administered 2021-11-04: 8 ug via INTRAVENOUS

## 2021-11-04 MED ORDER — LIDOCAINE HCL (CARDIAC) PF 100 MG/5ML IV SOSY
PREFILLED_SYRINGE | INTRAVENOUS | Status: DC | PRN
  Administered 2021-11-04: 50 mg via INTRAVENOUS

## 2021-11-04 MED ORDER — PROPOFOL 500 MG/50ML IV EMUL
INTRAVENOUS | Status: DC | PRN
  Administered 2021-11-04: 150 ug/kg/min via INTRAVENOUS

## 2021-11-04 MED ORDER — PROPOFOL 500 MG/50ML IV EMUL
INTRAVENOUS | Status: AC
  Filled 2021-11-04: qty 50

## 2021-11-04 MED ORDER — SODIUM CHLORIDE 0.9 % IV SOLN: INTRAVENOUS | Status: DC

## 2021-11-04 MED ORDER — PROPOFOL 10 MG/ML IV BOLUS
INTRAVENOUS | Status: DC | PRN
  Administered 2021-11-04: 60 mg via INTRAVENOUS

## 2021-11-04 MED ORDER — SIMETHICONE 40 MG/0.6ML PO SUSP
ORAL | Status: DC | PRN
  Administered 2021-11-04 (×2): 60 mL

## 2021-11-04 NOTE — Anesthesia Procedure Notes (Signed)
Procedure Name: MAC Date/Time: 11/04/2021 8:00 AM Performed by: Jerrye Noble, CRNA Pre-anesthesia Checklist: Emergency Drugs available, Suction available, Patient being monitored and Patient identified Patient Re-evaluated:Patient Re-evaluated prior to induction Oxygen Delivery Method: Nasal cannula

## 2021-11-04 NOTE — Op Note (Signed)
[facility] Gastroenterology Patient Name: Stacey Townsend Procedure Date: 11/04/2021 8:05 AM MRN: [id number] Account #: [id number] Date of Birth: Jun 25, 1976 Admit Type: Outpatient Age: 46 Room: [facility] ENDO ROOM 3 Gender: Female Note Status: Finalized Instrument Name: Park Meo [id number] Procedure:             Colonoscopy Indications:           Screening for colorectal malignant neoplasm Providers:             Jonathon Bellows MD, MD Referring MD:          Jearld Fenton (Referring MD) Medicines:             Monitored Anesthesia Care Complications:         No immediate complications. Procedure:             Pre-Anesthesia Assessment:                        - Prior to the procedure, a History and Physical was                         performed, and patient medications, allergies and                         sensitivities were reviewed. The patient's tolerance                         of previous anesthesia was reviewed.                        - The risks and benefits of the procedure and the                         sedation options and risks were discussed with the                         patient. All questions were answered and informed                         consent was obtained.                        - ASA Grade Assessment: II - A patient with mild                         systemic disease.                        After obtaining informed consent, the colonoscope was                         passed under direct vision. Throughout the procedure,                         the patient's blood pressure, pulse, and oxygen                         saturations were monitored continuously. The                         Colonoscope was introduced  through the anus and                         advanced to the the cecum, identified by the                         appendiceal orifice. The colonoscopy was performed                         with ease. The patient tolerated the procedure well.                          The quality of the bowel preparation was excellent. Findings:      The perianal and digital rectal examinations were normal.      A 12 mm polyp was found in the proximal descending colon. The polyp was       sessile. Preparations were made for mucosal resection. Saline was       injected to raise the lesion. Snare mucosal resection was performed.       Resection and retrieval were complete. Coagulation for\burning the edges       after EMR To prevent bleeding after mucosal resection, two hemostatic       clips were successfully placed. There was no bleeding during, or at the       end, of the procedure. Borders of polyp marked using NBI or blue light      The exam was otherwise without abnormality on direct and retroflexion       views. Impression:            - One 12 mm polyp in the proximal descending colon,                         removed with mucosal resection. Resected and                         retrieved. Treated with a hot snare. Clips were placed.                        - The examination was otherwise normal on direct and                         retroflexion views.                        - Mucosal resection was performed. Resection and                         retrieval were complete. Recommendation:        - Discharge patient to home (with escort).                        - Resume previous diet.                        - Continue present medications.                        - Await pathology results.                        -  Repeat colonoscopy in 3 years for surveillance based                         on pathology results. Procedure Code(s):     --- Professional ---                        [phone], Colonoscopy, flexible; with endoscopic mucosal                         resection Diagnosis Code(s):     --- Professional ---                        Z12.11, Encounter for screening for malignant neoplasm                         of colon                        K63.5,  Polyp of colon CPT copyright 2019 American Medical Association. All rights reserved. The codes documented in this report are preliminary and upon coder review may  be revised to meet current compliance requirements. Jonathon Bellows, MD Jonathon Bellows MD, MD 11/04/2021 8:37:27 AM This report has been signed electronically. Number of Addenda: 0 Note Initiated On: 11/04/2021 8:05 AM Scope Withdrawal Time: 0 hours 19 minutes 1 second  Total Procedure Duration: 0 hours 20 minutes 33 seconds  Estimated Blood Loss:  Estimated blood loss: none.      [facility]

## 2021-11-04 NOTE — Anesthesia Postprocedure Evaluation (Signed)
Anesthesia Post Note  Patient: Stacey Townsend  Procedure(s) Performed: COLONOSCOPY WITH PROPOFOL  Patient location during evaluation: PACU Anesthesia Type: General Level of consciousness: awake and alert, oriented and patient cooperative Pain management: pain level controlled Vital Signs Assessment: post-procedure vital signs reviewed and stable Respiratory status: spontaneous breathing, nonlabored ventilation and respiratory function stable Cardiovascular status: blood pressure returned to baseline and stable Postop Assessment: adequate PO intake Anesthetic complications: no   No notable events documented.   Last Vitals:  Vitals:   11/04/21 0850 11/04/21 0900  BP: 123/69 (!) 141/94  Pulse: 62 78  Resp: 14 18  Temp:    SpO2: 96% 97%    Last Pain:  Vitals:   11/04/21 0830  TempSrc: Temporal                 Darrin Nipper

## 2021-11-04 NOTE — Transfer of Care (Signed)
Immediate Anesthesia Transfer of Care Note  Patient: Stacey Townsend  Procedure(s) Performed: COLONOSCOPY WITH PROPOFOL  Patient Location: PACU and Endoscopy Unit  Anesthesia Type:General  Level of Consciousness: drowsy and patient cooperative  Airway & Oxygen Therapy: Patient Spontanous Breathing  Post-op Assessment: Report given to RN and Post -op Vital signs reviewed and stable  Post vital signs: Reviewed and stable  Last Vitals:  Vitals Value Taken Time  BP 109/41 11/04/2021  Temp    Pulse 86   Resp 14   SpO2 95     Last Pain:  Vitals:   11/04/21 0740  TempSrc: Temporal         Complications: No notable events documented.

## 2021-11-04 NOTE — Anesthesia Preprocedure Evaluation (Addendum)
Anesthesia Evaluation  Patient identified by MRN, date of birth, ID band Patient awake    Reviewed: Allergy & Precautions, NPO status , Patient's Chart, lab work & pertinent test results  History of Anesthesia Complications Negative for: history of anesthetic complications  Airway Mallampati: I   Neck ROM: Full    Dental  (+)    Pulmonary asthma ,    Pulmonary exam normal breath sounds clear to auscultation       Cardiovascular hypertension, Normal cardiovascular exam+ dysrhythmias (PSVT s/p ablation)  Rhythm:Regular Rate:Normal     Neuro/Psych negative neurological ROS     GI/Hepatic negative GI ROS,   Endo/Other  Class 3 obesity  Renal/GU negative Renal ROS     Musculoskeletal  (+) Arthritis ,   Abdominal   Peds  Hematology Hx DVT on Pradaxa   Anesthesia Other Findings Hematology note 10/28/21:  ASSESSMENT & PLAN:  1. Lupus anticoagulant positive  2. History of DVT (deep vein thrombosis)  3. Family history of blood clots   #Unprovoked DVT Patient has been on anticoagulation with Pradaxa since March 2022. Blood work was reviewed with patient  Negative Factor 5 leiden mutation, negative prothrombin gene mutation.  Negative anticardiolipin IgG/IgM.  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  We discussed about recommendation of switching to coumadin. Discussed about potential less efficacy of DOACs in APS.  Patient prefers to be kept on Pradaxa until confirmation is done in 12 weeks. Pradaxa has been effective so far and I think it is reasonable to continue for now. She has colonoscopy next week and will hold pradaxa for 2 days prior to procedure. I will repeat lupus anticoagulant when she is off pradaxa.     Reproductive/Obstetrics                            Anesthesia Physical Anesthesia Plan  ASA: 3  Anesthesia Plan: General   Post-op Pain Management:    Induction:  Intravenous  PONV Risk Score and Plan: 3 and Propofol infusion, TIVA and Treatment may vary due to age or medical condition  Airway Management Planned: Natural Airway  Additional Equipment:   Intra-op Plan:   Post-operative Plan:   Informed Consent: I have reviewed the patients History and Physical, chart, labs and discussed the procedure including the risks, benefits and alternatives for the proposed anesthesia with the patient or authorized representative who has indicated his/her understanding and acceptance.       Plan Discussed with: CRNA  Anesthesia Plan Comments: (LMA/GETA backup discussed.  Patient consented for risks of anesthesia including but not limited to:  - adverse reactions to medications - damage to eyes, teeth, lips or other oral mucosa - nerve damage due to positioning  - sore throat or hoarseness - damage to heart, brain, nerves, lungs, other parts of body or loss of life  Informed patient about role of CRNA in peri- and intra-operative care.  Patient voiced understanding.)        Anesthesia Quick Evaluation

## 2021-11-04 NOTE — H&P (Signed)
Jonathon Bellows, MD [address], [place], [place], [place], [address] [id number], [place], [place], [place], [address] Phone: [phone]  Fax: [phone]  Primary Care Physician:  Jearld Fenton, NP   Pre-Procedure History & Physical: HPI:  Stacey Townsend is a 46 y.o. female is here for an colonoscopy.   Past Medical History:  Diagnosis Date   Asthma    Family history of clotting disorder    History of DVT (deep vein thrombosis)    Hypertension    Osteoarthritis, knee    Supraventricular tachycardia, paroxysmal ([place]) 2017    Past Surgical History:  Procedure Laterality Date   DILATION AND CURETTAGE OF UTERUS  2013   endometerial ablation  2016   HYSTEROSCOPY  2013    Prior to Admission medications   Medication Sig Start Date End Date Taking? Authorizing Provider  albuterol (VENTOLIN HFA) 108 (90 Base) MCG/ACT inhaler Shake well and inhale 2 puffs by mouth every 4 hours as needed for shortness of breath, cough or wheezing 04/01/21 04/01/22 Yes [provider]  cetirizine (ZYRTEC) 10 MG tablet Take 10 mg by mouth daily.   Yes [provider]  ciclesonide (ALVESCO) 80 MCG/ACT inhaler Inhale 1 puff into the lungs 2 (two) times daily.   Yes [provider]  dabigatran (PRADAXA) 150 MG CAPS capsule Take 1 capsule (150 mg total) by mouth 2 (two) times daily. 09/16/21  Yes Baity, Coralie Keens, NP  lisinopril-hydrochlorothiazide (ZESTORETIC) 20-25 MG tablet Take one-half tablet by mouth daily for 1 week. Only increase to 1 tablet daily if blood pressure higher than 140/90. 01/04/21 01/04/22 Yes [provider]  lovastatin (MEVACOR) 10 MG tablet Take 1 tablet (10 mg total) by mouth at bedtime. 11/01/21   Jearld Fenton, NP    Allergies as of 10/07/2021 - Review Complete 10/07/2021  Allergen Reaction Noted   Penicillins Shortness Of Breath 11/05/2013    Family History  Problem Relation Age of Onset   Deep vein thrombosis Mother     Thyroid disease Mother    Hypertension Father    Diabetes Father    Deep vein thrombosis Sister    Thyroid disease Sister    Hypertension Sister    Healthy Sister    Breast cancer Paternal Grandmother    Deep vein thrombosis Maternal Uncle    Deep vein thrombosis Paternal Aunt    Breast cancer Paternal Aunt    Deep vein thrombosis Paternal Uncle     Social History   Socioeconomic History   Marital status: Single    Spouse name: Not on file   Number of children: Not on file   Years of education: Not on file   Highest education level: Not on file  Occupational History   Not on file  Tobacco Use   Smoking status: Never   Smokeless tobacco: Never  Vaping Use   Vaping Use: Former   Devices: only 2-3 months  Substance and Sexual Activity   Alcohol use: Yes    Alcohol/week: 7.0 standard drinks    Types: 7 Standard drinks or equivalent per week   Drug use: Never   Sexual activity: Yes  Other Topics Concern   Not on file  Social History Narrative   Not on file   Social Determinants of Health   Financial Resource Strain: Not on file  Food Insecurity: Not on file  Transportation Needs: Not on file  Physical Activity: Not on file  Stress: Not on file  Social Connections: Not on file  Intimate Partner Violence: Not on file    Review of Systems: See HPI, otherwise negative ROS  Physical Exam: BP (!) 154/96    Pulse 66    Temp (!) 96.9 F (36.1 C) (Temporal)    Resp 20    Ht 5\' 6"  (1.676 m)    Wt 127.9 kg    SpO2 100%    BMI 45.52 kg/m  General:   Alert,  pleasant and cooperative in NAD Head:  Normocephalic and atraumatic. Neck:  Supple; no masses or thyromegaly. Lungs:  Clear throughout to auscultation, normal respiratory effort.    Heart:  +S1, +S2, Regular rate and rhythm, No edema. Abdomen:  Soft, nontender and nondistended. Normal bowel sounds, without guarding, and without rebound.   Neurologic:  Alert and  oriented x4;  grossly normal  neurologically.  Impression/Plan: Stacey Townsend is here for an colonoscopy to be performed for Screening colonoscopy average risk   Risks, benefits, limitations, and alternatives regarding  colonoscopy have been reviewed with the patient.  Questions have been answered.  All parties agreeable.   Jonathon Bellows, MD  11/04/2021, 8:00 AM

## 2021-11-05 ENCOUNTER — Encounter: Payer: Self-pay | Admitting: Gastroenterology

## 2021-11-05 LAB — LUPUS ANTICOAGULANT PANEL
DRVVT: 48 s — ABNORMAL HIGH (ref 0.0–47.0)
PTT Lupus Anticoagulant: 29 s (ref 0.0–43.5)

## 2021-11-05 LAB — BETA-2-GLYCOPROTEIN I ABS, IGG/M/A
Beta-2 Glyco I IgG: <9 GPI IgG units (ref 0–20)
Beta-2-Glycoprotein I IgA: <9 GPI IgA units (ref 0–25)
Beta-2-Glycoprotein I IgM: <9 GPI IgM units (ref 0–32)

## 2021-11-05 LAB — DRVVT MIX: dRVVT Mix: 43.2 s — ABNORMAL HIGH (ref 0.0–40.4)

## 2021-11-05 LAB — DRVVT CONFIRM: dRVVT Confirm: 1.3 ratio — ABNORMAL HIGH (ref 0.8–1.2)

## 2021-11-05 LAB — SURGICAL PATHOLOGY

## 2021-11-11 ENCOUNTER — Encounter: Payer: Self-pay | Admitting: Gastroenterology

## 2022-01-24 ENCOUNTER — Telehealth: Payer: Self-pay | Admitting: Pharmacist

## 2022-01-24 NOTE — Telephone Encounter (Signed)
?  Chronic Care Management  ? ?Outreach Note ? ?01/24/2022 ?Name: Stacey Townsend MRN: [id number] DOB: [birth year] ? ? ?Patient appearing on report for [place] [employer] Hypertension Control due to last documented ambulatory blood pressure of 148/92 on 10/28/2021. No next appointment with PCP currently scheduled. ? ?Outreached patient to discuss hypertension control and medication management. Was unable to reach patient via telephone and have left HIPAA compliant voicemail asking patient to return my call.  ? ? ?Follow Up Plan: CM Pharmacist will outreach to patient by telephone again within the next 30 days ? ?Wallace Cullens, PharmD, BCACP ?Clinical Pharmacist ?[facility] ?[place] ?[phone] ? ?

## 2022-01-25 ENCOUNTER — Inpatient Hospital Stay: Payer: BC Managed Care – PPO | Attending: Oncology

## 2022-01-25 DIAGNOSIS — Z86718 Personal history of other venous thrombosis and embolism: Secondary | ICD-10-CM | POA: Insufficient documentation

## 2022-02-01 ENCOUNTER — Inpatient Hospital Stay: Payer: BC Managed Care – PPO | Admitting: Oncology

## 2022-02-04 ENCOUNTER — Inpatient Hospital Stay: Payer: BC Managed Care – PPO

## 2022-02-04 DIAGNOSIS — Z86718 Personal history of other venous thrombosis and embolism: Secondary | ICD-10-CM

## 2022-02-04 LAB — COMPREHENSIVE METABOLIC PANEL
ALT: 20 U/L (ref 0–44)
AST: 19 U/L (ref 15–41)
Albumin: 4.3 g/dL (ref 3.5–5.0)
Alkaline Phosphatase: 59 U/L (ref 38–126)
Anion gap: 6 (ref 5–15)
BUN: 13 mg/dL (ref 6–20)
CO2: 28 mmol/L (ref 22–32)
Calcium: 8.8 mg/dL — ABNORMAL LOW (ref 8.9–10.3)
Chloride: 101 mmol/L (ref 98–111)
Creatinine, Ser: 0.88 mg/dL (ref 0.44–1.00)
GFR, Estimated: >60 mL/min (ref >60)
Glucose, Bld: 98 mg/dL (ref 70–99)
Potassium: 3.8 mmol/L (ref 3.5–5.1)
Sodium: 135 mmol/L (ref 135–145)
Total Bilirubin: 0.5 mg/dL (ref 0.3–1.2)
Total Protein: 7.4 g/dL (ref 6.5–8.1)

## 2022-02-04 LAB — CBC WITH DIFFERENTIAL/PLATELET
Abs Immature Granulocytes: 0.02 10*3/uL (ref 0.00–0.07)
Basophils Absolute: 0.1 10*3/uL (ref 0.0–0.1)
Basophils Relative: 1 %
Eosinophils Absolute: 0.2 10*3/uL (ref 0.0–0.5)
Eosinophils Relative: 3 %
HCT: 40.2 % (ref 36.0–46.0)
Hemoglobin: 12.9 g/dL (ref 12.0–15.0)
Immature Granulocytes: 0 %
Lymphocytes Relative: 28 %
Lymphs Abs: 1.7 10*3/uL (ref 0.7–4.0)
MCH: 25.1 pg — ABNORMAL LOW (ref 26.0–34.0)
MCHC: 32.1 g/dL (ref 30.0–36.0)
MCV: 78.2 fL — ABNORMAL LOW (ref 80.0–100.0)
Monocytes Absolute: 0.5 10*3/uL (ref 0.1–1.0)
Monocytes Relative: 7 %
Neutro Abs: 3.8 10*3/uL (ref 1.7–7.7)
Neutrophils Relative %: 61 %
Platelets: 230 10*3/uL (ref 150–400)
RBC: 5.14 MIL/uL — ABNORMAL HIGH (ref 3.87–5.11)
RDW: 14.7 % (ref 11.5–15.5)
WBC: 6.2 10*3/uL (ref 4.0–10.5)
nRBC: 0 % (ref 0.0–0.2)

## 2022-02-05 LAB — DRVVT CONFIRM: dRVVT Confirm: 1.2 ratio (ref 0.8–1.2)

## 2022-02-05 LAB — LUPUS ANTICOAGULANT PANEL
DRVVT: 49.1 s — ABNORMAL HIGH (ref 0.0–47.0)
PTT Lupus Anticoagulant: 27 s (ref 0.0–43.5)

## 2022-02-05 LAB — DRVVT MIX: dRVVT Mix: 44.9 s — ABNORMAL HIGH (ref 0.0–40.4)

## 2022-02-14 ENCOUNTER — Inpatient Hospital Stay: Payer: BC Managed Care – PPO | Attending: Oncology | Admitting: Oncology

## 2022-02-14 ENCOUNTER — Ambulatory Visit: Payer: BC Managed Care – PPO | Admitting: Pharmacist

## 2022-02-14 ENCOUNTER — Encounter: Payer: Self-pay | Admitting: Oncology

## 2022-02-14 ENCOUNTER — Telehealth: Payer: Self-pay | Admitting: Pharmacist

## 2022-02-14 VITALS — BP 144/89 | HR 81 | Temp 97.4°F | Resp 18 | Wt 285.4 lb

## 2022-02-14 DIAGNOSIS — Z86718 Personal history of other venous thrombosis and embolism: Secondary | ICD-10-CM | POA: Diagnosis present

## 2022-02-14 DIAGNOSIS — I10 Essential (primary) hypertension: Secondary | ICD-10-CM

## 2022-02-14 DIAGNOSIS — Z803 Family history of malignant neoplasm of breast: Secondary | ICD-10-CM | POA: Insufficient documentation

## 2022-02-14 MED ORDER — APIXABAN 2.5 MG PO TABS: 2.5000 mg | ORAL_TABLET | Freq: Two times a day (BID) | ORAL | 5 refills | Status: DC

## 2022-02-14 MED ORDER — LISINOPRIL-HYDROCHLOROTHIAZIDE 20-25 MG PO TABS: 0.5000 | ORAL_TABLET | Freq: Every day | ORAL | 1 refills | Status: DC

## 2022-02-14 MED ORDER — ALBUTEROL SULFATE HFA 108 (90 BASE) MCG/ACT IN AERS: 2.0000 | INHALATION_SPRAY | RESPIRATORY_TRACT | 5 refills | Status: DC | PRN

## 2022-02-14 NOTE — Chronic Care Management (AMB) (Signed)
Chronic Care Management   Outreach Note  02/14/2022 Name: AILANY KOREN MRN: [id number] DOB: [birth year]  I connected with [name] on 02/14/22 by telephone outreach and verified that I am speaking with the correct person using two identifiers.  Patient appearing on report for [place] Metric Hypertension Control due to last documented ambulatory blood pressure of 148/92 on 10/28/2021. No next appointment with PCP currently scheduled.  Outreached patient to discuss hypertension control and medication management.   Outpatient Encounter Medications as of 02/14/2022  Medication Sig   albuterol (VENTOLIN HFA) 108 (90 Base) MCG/ACT inhaler Shake well and inhale 2 puffs by mouth every 4 hours as needed for shortness of breath, cough or wheezing   cetirizine (ZYRTEC) 10 MG tablet Take 10 mg by mouth daily as needed.   ciclesonide (ALVESCO) 80 MCG/ACT inhaler Inhale 1 puff into the lungs 2 (two) times daily.   lisinopril-hydrochlorothiazide (ZESTORETIC) 20-25 MG tablet Take 0.5 tablets by mouth daily.   lovastatin (MEVACOR) 10 MG tablet Take 1 tablet (10 mg total) by mouth at bedtime.   apixaban (ELIQUIS) 2.5 MG TABS tablet Take 1 tablet (2.5 mg total) by mouth 2 (two) times daily.   No facility-administered encounter medications on file as of 02/14/2022.    Lab Results  Component Value Date   CREATININE 0.88 02/04/2022   BUN 13 02/04/2022   NA 135 02/04/2022   K 3.8 02/04/2022   CL 101 02/04/2022   CO2 28 02/04/2022    BP Readings from Last 3 Encounters:  02/14/22 (!) 144/89  11/04/21 (!) 141/94  10/28/21 (!) 148/92    Pulse Readings from Last 3 Encounters:  02/14/22 81  11/04/21 78  10/28/21 62    Current medications: lisinopril-hydrochlorothiazide 20-25 mg -  tablet daily Admits to missing a dose at least once/week Counsel patient on importance of medication adherence and encourage her to start using adherence tools including weekly pillbox and daily phone  alarm Identify patient in need of refill of lisinopril-hydrochlorothiazide 20-25 mg Rx. Patient reports last Rx prescribed by previous PCP prior to moving to [facility] from [place]  Home Monitoring: Patient does not have an automated upper arm home BP machine Encourage patient to obtain an upper arm home blood pressure monitor. Counsel on importance of obtaining a monitor that fits her arm for accuracy of readings  Counsel on BP monitoring technique  Admits to sometimes adding salt to her food Counsel patient on the impact of salt and sodium on blood pressure Encourage patient to review nutrition labels and limit salt/sodium intake  Current physical activity: reports stays active throughout the day at work x 5 days/week   Assessment/Plan: - Currently uncontrolled - Reviewed goal blood pressure <140/90 - Reviewed appropriate administration of medication regimen - Counseled on long term microvascular and macrovascular complications of uncontrolled hypertension - Reviewed appropriate home BP monitoring technique (avoid caffeine, smoking, and exercise for 30 minutes before checking, rest for at least 5 minutes before taking BP, sit with feet flat on the floor and back against a hard surface, uncross legs, and rest arm on flat surface) - Will mail patient blood pressure log and educational handouts as requested - Reviewed to check blood pressure daily, document, and provide at next provider visit - Discussed dietary modifications, such as reduced salt intake, focus on whole grains, vegetables, lean proteins - Reviewed strategies to improve medication adherence such as using weekly pillbox and daily phone alarms - From review of chart, note PCP advised patient to  return to office ~5/16 for recheck of lipid panel and ~04/27/2022 for follow up office Visit. Recommend patient contact office today to schedule lab appointment and follow up office visit    Follow Up Plan: CM Pharmacist will outreach to  patient by telephone again on 03/18/2022 at 10:45 am  Wallace Cullens, PharmD, [facility] [phone]

## 2022-02-14 NOTE — Progress Notes (Signed)
Pt here for follow up. No new concerns voiced.   

## 2022-02-14 NOTE — Patient Instructions (Signed)
Check your blood pressure once daily, and any time you have concerning symptoms like headache, chest pain, dizziness, shortness of breath, or vision changes.   Our goal is less than 140/90.  To appropriately check your blood pressure, make sure you do the following:  1) Avoid caffeine, exercise, or tobacco products for 30 minutes before checking. Empty your bladder. 2) Sit with your back supported in a flat-backed chair. Rest your arm on something flat (arm of the chair, table, etc). 3) Sit still with your feet flat on the floor, resting, for at least 5 minutes.  4) Check your blood pressure. Take 1-2 readings.  5) Write down these readings and bring with you to any provider appointments.  Bring your home blood pressure machine with you to a provider's office for accuracy comparison at least once a year.   Make sure you take your blood pressure medications before you come to any office visit, even if you were asked to fast for labs.  Momoko Slezak Lizbett Garciagarcia, PharmD, BCACP Clinical Pharmacist [facility] [place] [phone]  

## 2022-02-14 NOTE — Telephone Encounter (Signed)
Patient requesting renewal of lisinopril-hydrochlorothiazide 20-25 mg Rx (reports takes  tablet daily) and albuterol Rx. Note patient reports previous Rxs from PCP in [place].  Requests Rxs be sent to [place], [place]  Thank you!   Stacey Townsend

## 2022-02-14 NOTE — Progress Notes (Signed)
Hematology/Oncology Progress note [phone] [phone]         Patient Care Team: Jearld Fenton, NP as PCP - General (Internal Medicine) Earlie Server, MD as Consulting Physician (Hematology and Oncology)  REFERRING PROVIDER: Jearld Fenton, NP  CHIEF COMPLAINTS/REASON FOR VISIT:   history of DVT  HISTORY OF PRESENTING ILLNESS:   Stacey Townsend is a  46 y.o.  female with PMH listed below was seen in consultation at the request of  Jearld Fenton, NP  for evaluation of history of DVT  12/02/2020, patient presented to urgent care for evaluation of left lower extremity pain.  Was diagnosed with isolated calf DVT involving one of the posterior tibial and peroneal veins.  Patient was started on Lovenox bridged Pradaxa 150 mg twice daily.  Patient tolerates anticoagulation.  There was no immobilization factors prior to her symptom presentation. She had COVID 19 infection in January 2022.  Patient moved to [place] from [place] in July 2022.  She has establish care with primary care provider and was referred to establish care with hematology for management of history of DVT and anticoagulation.  Patient reports significant family history of blood clots, mother, sister, uncle. Patient denies smoking. Family history positive for breast cancer, paternal aunt and maternal grandmother.  INTERVAL HISTORY Stacey Townsend is a 46 y.o. female who has above history reviewed by me today presents for follow up visit for management of history of DVT Patient currently is on Pradaxa.  She has had a blood work done and presents to discuss results.    Review of Systems  Constitutional:  Negative for appetite change, chills, fatigue and fever.  HENT:   Negative for hearing loss and voice change.   Eyes:  Negative for eye problems.  Respiratory:  Negative for chest tightness and cough.   Cardiovascular:  Negative for chest pain.  Gastrointestinal:  Negative for  abdominal distention, abdominal pain and blood in stool.  Endocrine: Negative for hot flashes.  Genitourinary:  Negative for difficulty urinating and frequency.   Musculoskeletal:  Negative for arthralgias.  Skin:  Negative for itching and rash.  Neurological:  Negative for extremity weakness.  Hematological:  Negative for adenopathy.  Psychiatric/Behavioral:  Negative for confusion.    MEDICAL HISTORY:  Past Medical History:  Diagnosis Date   Asthma    Family history of clotting disorder    History of DVT (deep vein thrombosis)    Hypertension    Osteoarthritis, knee    Supraventricular tachycardia, paroxysmal ([place]) 2017    SURGICAL HISTORY: Past Surgical History:  Procedure Laterality Date   COLONOSCOPY WITH PROPOFOL N/A 11/04/2021   Procedure: COLONOSCOPY WITH PROPOFOL;  Surgeon: Jonathon Bellows, MD;  Location: [facility] ENDOSCOPY;  Service: Gastroenterology;  Laterality: N/A;   DILATION AND CURETTAGE OF UTERUS  2013   endometerial ablation  2016   HYSTEROSCOPY  2013    SOCIAL HISTORY: Social History   Socioeconomic History   Marital status: Single    Spouse name: Not on file   Number of children: Not on file   Years of education: Not on file   Highest education level: Not on file  Occupational History   Not on file  Tobacco Use   Smoking status: Never   Smokeless tobacco: Never  Vaping Use   Vaping Use: Former   Devices: only 2-3 months  Substance and Sexual Activity   Alcohol use: Yes    Alcohol/week: 7.0 standard drinks    Types: 7  Standard drinks or equivalent per week   Drug use: Never   Sexual activity: Yes  Other Topics Concern   Not on file  Social History Narrative   Not on file   Social Determinants of Health   Financial Resource Strain: Not on file  Food Insecurity: Not on file  Transportation Needs: Not on file  Physical Activity: Not on file  Stress: Not on file  Social Connections: Not on file  Intimate Partner Violence: Not on file     FAMILY HISTORY: Family History  Problem Relation Age of Onset   Deep vein thrombosis Mother    Thyroid disease Mother    Hypertension Father    Diabetes Father    Deep vein thrombosis Sister    Thyroid disease Sister    Hypertension Sister    Healthy Sister    Breast cancer Paternal Grandmother    Deep vein thrombosis Maternal Uncle    Deep vein thrombosis Paternal Aunt    Breast cancer Paternal Aunt    Deep vein thrombosis Paternal Uncle     ALLERGIES:  is allergic to penicillins.  MEDICATIONS:  Current Outpatient Medications  Medication Sig Dispense Refill   apixaban (ELIQUIS) 2.5 MG TABS tablet Take 1 tablet (2.5 mg total) by mouth 2 (two) times daily. 60 tablet 5   cetirizine (ZYRTEC) 10 MG tablet Take 10 mg by mouth daily as needed.     ciclesonide (ALVESCO) 80 MCG/ACT inhaler Inhale 1 puff into the lungs 2 (two) times daily.     lovastatin (MEVACOR) 10 MG tablet Take 1 tablet (10 mg total) by mouth at bedtime. 30 tablet 2   albuterol (VENTOLIN HFA) 108 (90 Base) MCG/ACT inhaler Inhale 2 puffs into the lungs every 4 (four) hours as needed for wheezing or shortness of breath. 1 each 5   lisinopril-hydrochlorothiazide (ZESTORETIC) 20-25 MG tablet Take 0.5 tablets by mouth daily. 45 tablet 1   No current facility-administered medications for this visit.     PHYSICAL EXAMINATION: ECOG PERFORMANCE STATUS: 0 - Asymptomatic Vitals:   02/14/22 1416  BP: (!) 144/89  Pulse: 81  Resp: 18  Temp: (!) 97.4 F (36.3 C)   Filed Weights   02/14/22 1416  Weight: 285 lb 6.4 oz (129.5 kg)    Physical Exam Constitutional:      General: She is not in acute distress. HENT:     Head: Normocephalic and atraumatic.  Eyes:     General: No scleral icterus. Cardiovascular:     Rate and Rhythm: Normal rate and regular rhythm.     Heart sounds: Normal heart sounds.  Pulmonary:     Effort: Pulmonary effort is normal. No respiratory distress.     Breath sounds: No wheezing.   Abdominal:     General: Bowel sounds are normal. There is no distension.     Palpations: Abdomen is soft.  Musculoskeletal:        General: No deformity. Normal range of motion.     Cervical back: Normal range of motion and neck supple.  Skin:    General: Skin is warm and dry.     Findings: No erythema or rash.  Neurological:     Mental Status: She is alert and oriented to person, place, and time. Mental status is at baseline.     Cranial Nerves: No cranial nerve deficit.     Coordination: Coordination normal.  Psychiatric:        Mood and Affect: Mood normal.    LABORATORY DATA:  I have reviewed the data as listed Lab Results  Component Value Date   WBC 6.2 02/04/2022   HGB 12.9 02/04/2022   HCT 40.2 02/04/2022   MCV 78.2 (L) 02/04/2022   PLT 230 02/04/2022   Recent Labs    10/07/21 1046 02/04/22 0940  NA 136 135  K 3.6 3.8  CL 98 101  CO2 30 28  GLUCOSE 96 98  BUN 12 13  CREATININE 0.83 0.88  CALCIUM 9.3 8.8*  GFRNONAA >60 >60  PROT 7.6 7.4  ALBUMIN 4.3 4.3  AST 21 19  ALT 23 20  ALKPHOS 59 59  BILITOT 0.6 0.5    Iron/TIBC/Ferritin/ %Sat No results found for: IRON, TIBC, FERRITIN, IRONPCTSAT    RADIOGRAPHIC STUDIES: I have personally reviewed the radiological images as listed and agreed with the findings in the report. No results found.    ASSESSMENT & PLAN:  1. History of DVT (deep vein thrombosis)    #Unprovoked DVT Patient has been on anticoagulation with Pradaxa since March 2022. Blood work was reviewed with patient  Negative Factor 5 leiden mutation, negative prothrombin gene mutation.  Negative anticardiolipin IgG/IgM.  10/07/2021 positive lupus anticoagulant 2/23/ 2023, repeat testing showed persistently positive lupus anticoagulant.  This was done when she was off Pradaxa. 02/04/2022, repeat testing showed negative lupus anticoagulant.  Not meeting APS diagnosis criteria Patient has been on Pradaxa for over a year. Recommend switch to  Eliquis 2.5 mg twice daily for long-term anticoagulation prophylaxis.  She agrees with the plan.  Prescription was sent to pharmacy.   Orders Placed This Encounter  Procedures   CBC with Differential/Platelet    Standing Status:   Future    Standing Expiration Date:   02/15/2023   Comprehensive metabolic panel    Standing Status:   Future    Standing Expiration Date:   02/15/2023    All questions were answered. The patient knows to call the clinic with any problems questions or concerns.  cc Jearld Fenton, NP    Return of visit: 6 months   Earlie Server, MD, PhD [facility] Hematology Oncology 02/14/2022

## 2022-02-15 ENCOUNTER — Other Ambulatory Visit: Payer: Self-pay | Admitting: Internal Medicine

## 2022-02-15 MED ORDER — ALBUTEROL SULFATE HFA 108 (90 BASE) MCG/ACT IN AERS: 2.0000 | INHALATION_SPRAY | RESPIRATORY_TRACT | 5 refills | Status: DC | PRN

## 2022-02-15 MED ORDER — LISINOPRIL-HYDROCHLOROTHIAZIDE 20-25 MG PO TABS: 0.5000 | ORAL_TABLET | Freq: Every day | ORAL | 1 refills | Status: DC

## 2022-03-18 ENCOUNTER — Ambulatory Visit: Payer: BC Managed Care – PPO | Admitting: Pharmacist

## 2022-03-18 DIAGNOSIS — I10 Essential (primary) hypertension: Secondary | ICD-10-CM

## 2022-03-18 NOTE — Patient Instructions (Signed)
Check your blood pressure once daily, and any time you have concerning symptoms like headache, chest pain, dizziness, shortness of breath, or vision changes.   Our goal is less than 140/90.  To appropriately check your blood pressure, make sure you do the following:  1) Avoid caffeine, exercise, or tobacco products for 30 minutes before checking. Empty your bladder. 2) Sit with your back supported in a flat-backed chair. Rest your arm on something flat (arm of the chair, table, etc). 3) Sit still with your feet flat on the floor, resting, for at least 5 minutes.  4) Check your blood pressure. Take 1-2 readings.  5) Write down these readings and bring with you to any provider appointments.  Bring your home blood pressure machine with you to a provider's office for accuracy comparison at least once a year.   Make sure you take your blood pressure medications before you come to any office visit, even if you were asked to fast for labs.  Vernida Mcnicholas Suri Tafolla, PharmD, BCACP Clinical Pharmacist [facility] [place] [phone]  

## 2022-03-18 NOTE — Chronic Care Management (AMB) (Signed)
  Chronic Care Management   Outreach Note  03/18/2022 Name: Stacey Townsend MRN: [id number] DOB: 01/23/1976  I connected with Stacey Townsend on 03/18/22 by telephone outreach and verified that I am speaking with the correct person using two identifiers.  Patient appearing on report for [place] Metric Hypertension Control due to last documented ambulatory blood pressure of 148/92 on 10/28/2021. No next appointment with PCP currently scheduled.  Outreached patient to discuss hypertension control and medication management.   Outpatient Encounter Medications as of 03/18/2022  Medication Sig   albuterol (VENTOLIN HFA) 108 (90 Base) MCG/ACT inhaler Inhale 2 puffs into the lungs every 4 (four) hours as needed for wheezing or shortness of breath.   apixaban (ELIQUIS) 2.5 MG TABS tablet Take 1 tablet (2.5 mg total) by mouth 2 (two) times daily.   cetirizine (ZYRTEC) 10 MG tablet Take 10 mg by mouth daily as needed.   ciclesonide (ALVESCO) 80 MCG/ACT inhaler Inhale 1 puff into the lungs 2 (two) times daily.   lisinopril-hydrochlorothiazide (ZESTORETIC) 20-25 MG tablet Take 0.5 tablets by mouth daily.   lovastatin (MEVACOR) 10 MG tablet Take 1 tablet (10 mg total) by mouth at bedtime.   No facility-administered encounter medications on file as of 03/18/2022.    Lab Results  Component Value Date   CREATININE 0.88 02/04/2022   BUN 13 02/04/2022   NA 135 02/04/2022   K 3.8 02/04/2022   CL 101 02/04/2022   CO2 28 02/04/2022    BP Readings from Last 3 Encounters:  02/14/22 (!) 144/89  11/04/21 (!) 141/94  10/28/21 (!) 148/92    Pulse Readings from Last 3 Encounters:  02/14/22 81  11/04/21 78  10/28/21 62    Current medications: lisinopril-hydrochlorothiazide 20-25 mg -  tablet daily Reports adherence now improved as daughter assisting to remind patient. Denies missed doses   Home Monitoring: Patient does not have an automated upper arm home BP machine Have encouraged patient  to obtain an upper arm home blood pressure monitor. Reports recently checked on blood pressure machine at work. Did not record reading, but recalls SBP reading ~139   Again counsel patient on the impact of salt and sodium on blood pressure Encourage patient to review nutrition labels and limit salt/sodium intake   Current physical activity: reports stays active throughout the day at work x 5 days/week   Assessment/Plan: - Currently uncontrolled - Reviewed goal blood pressure <140/90 - Reviewed appropriate administration of medication regimen - Counseled on long term microvascular and macrovascular complications of uncontrolled hypertension - Reviewed to check blood pressure, document, and provide at next provider visit - Discussed dietary modifications, such as reduced salt intake, focus on whole grains, vegetables, lean proteins - Reviewed strategies to aid with medication adherence such as using weekly pillbox and daily phone alarms - From review of chart, note PCP advised patient to return to office ~5/16 for recheck of lipid panel and ~04/27/2022 for follow up office Visit. Again recommend patient contact office today to schedule lab appointment and follow up office visit      Follow Up Plan: CM Pharmacist will outreach to patient by telephone again within the next 60 days  Wallace Cullens, PharmD, [facility] [place] [phone]

## 2022-04-20 ENCOUNTER — Ambulatory Visit: Payer: BC Managed Care – PPO | Admitting: Family Medicine

## 2022-04-20 ENCOUNTER — Encounter: Payer: Self-pay | Admitting: Family Medicine

## 2022-04-20 VITALS — BP 154/83 | HR 94 | Temp 96.9°F | Wt 289.0 lb

## 2022-04-20 DIAGNOSIS — M5441 Lumbago with sciatica, right side: Secondary | ICD-10-CM | POA: Diagnosis not present

## 2022-04-20 MED ORDER — PREDNISONE 10 MG PO TABS: ORAL_TABLET | ORAL | 0 refills | Status: DC

## 2022-04-20 MED ORDER — BACLOFEN 10 MG PO TABS: 5.0000 mg | ORAL_TABLET | Freq: Three times a day (TID) | ORAL | 1 refills | Status: DC | PRN

## 2022-04-20 NOTE — Patient Instructions (Addendum)
Thank you for coming to the office today.  1. For your Back Pain - I think that this is due to Muscle Spasms or strain. Your Sciatic Nerve can be affected causing some of your radiation and numbness down your legs. 2. Start Baclofen (Lioresal) '10mg'$  tablets - cut in half for '5mg'$  at night for muscle relaxant - may make you sedated or sleepy (be careful driving or working on this) if tolerated you can take every 8 hours, half or whole tab 3. May use Tylenol Extra Str '500mg'$  tabs - may take 2 tablets = '1000mg'$  every 6-8 hours or 3 times a day, prefer regular dosing for now then can phase down as needed  Recommend to start using heating pad on your lower back 1-2x daily for few weeks  Also try a Wedge Seat Cushion to avoid nerve pinching when sitting prolonged period of time.  This pain may take weeks to months to fully resolve, but hopefully it will respond to the medicine initially. All back injuries (small or serious) are slow to heal since we use our back muscles every day. Be careful with turning, twisting, lifting, sitting / standing for prolonged periods, and avoid re-injury.  If your symptoms significantly worsen with more pain, or new symptoms with weakness in one or both legs, new or different shooting leg pains, numbness in legs or groin, loss of control or retention of urine or bowel movements, please call back for advice and you may need to go directly to the Emergency Department.  ONLY IF NEEDED Will PRINT a back up plan - Start Prednisone taper (steroid anti-inflammatory) for nerve irritation with pain in legs. Each pill is '10mg'$ . Take 6 pills ('60mg'$  daily) for 1 day at same time with breakfast, then each day reduce dose by 1 pill, so 5 pills, then 4, then 3, then 2 then 1 (last 6 days). Do not take any Ibuprofen or Aleve while taking the Prednisone.    Please schedule a Follow-up Appointment to: Return in about 4 weeks (around 05/18/2022), or if symptoms worsen or fail to improve, for 4-6  weeks as needed for back pain if not improved.  If you have any other questions or concerns, please feel free to call the office or send a message through [facility]. You may also schedule an earlier appointment if necessary.  Additionally, you may be receiving a survey about your experience at our office within a few days to 1 week by e-mail or mail. We value your feedback.  Nobie Putnam, DO [facility], [facility]             Low Back Pain Exercises  See other page with pictures of each exercise.  Start with 1 or 2 of these exercises that you are most comfortable with. Do not do any exercises that cause you significant worsening pain. Some of these may cause some "stretching soreness" but it should go away after you stop the exercise, and get better over time. Gradually increase up to 3-4 exercises as tolerated.  Standing hamstring stretch: Place the heel of your leg on a stool about 15 inches high. Keep your knee straight. Lean forward, bending at the hips until you feel a mild stretch in the back of your thigh. Make sure you do not roll your shoulders and bend at the waist when doing this or you will stretch your lower back instead. Hold the stretch for 15 to 30 seconds. Repeat 3 times. Repeat the same stretch on  your other leg.  Cat and camel: Get down on your hands and knees. Let your stomach sag, allowing your back to curve downward. Hold this position for 5 seconds. Then arch your back and hold for 5 seconds. Do 3 sets of 10.  Quadriped Arm/Leg Raises: Get down on your hands and knees. Tighten your abdominal muscles to stiffen your spine. While keeping your abdominals tight, raise one arm and the opposite leg away from you. Hold this position for 5 seconds. Lower your arm and leg slowly and alternate sides. Do this 10 times on each side.  Pelvic tilt: Lie on your back with your knees bent and your feet flat on the floor. Tighten your abdominal muscles and push  your lower back into the floor. Hold this position for 5 seconds, then relax. Do 3 sets of 10.  Partial curl: Lie on your back with your knees bent and your feet flat on the floor. Tighten your stomach muscles and flatten your back against the floor. Tuck your chin to your chest. With your hands stretched out in front of you, curl your upper body forward until your shoulders clear the floor. Hold this position for 3 seconds. Don't hold your breath. It helps to breathe out as you lift your shoulders up. Relax. Repeat 10 times. Build to 3 sets of 10. To challenge yourself, clasp your hands behind your head and keep your elbows out to the side.  Lower trunk rotation: Lie on your back with your knees bent and your feet flat on the floor. Tighten your abdominal muscles and push your lower back into the floor. Keeping your shoulders down flat, gently rotate your legs to one side, then the other as far as you can. Repeat 10 to 20 times.  Single knee to chest stretch: Lie on your back with your legs straight out in front of you. Bring one knee up to your chest and grasp the back of your thigh. Pull your knee toward your chest, stretching your buttock muscle. Hold this position for 15 to 30 seconds and return to the starting position. Repeat 3 times on each side.  Double knee to chest: Lie on your back with your knees bent and your feet flat on the floor. Tighten your abdominal muscles and push your lower back into the floor. Pull both knees up to your chest. Hold for 5 seconds and repeat 10 to 20 times.

## 2022-04-20 NOTE — Progress Notes (Unsigned)
Subjective:    Patient ID: Stacey Townsend, female    DOB: [birth year], 46 y.o.   MRN: [id number]  Stacey Townsend is a 46 y.o. female presenting on 04/20/2022 for Back Pain (Started last week. )  PCP Webb Silversmith, FNP Patient presents for a same day appointment.  HPI  LOW BACK PAIN, Right-sided and into R leg / Sciatica - Reports symptoms started about 5 ago without clear inciting injury, she admits only difference was with recent car ride and also sleeping in different bed. Noticed gradual worsening of R low back pain, was not sudden. No trauma or injury. She said difficulty with change of position from laying down to sitting up. She feels pinching in R low back into leg.  Today seems to be gradually improving. 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  - Taking Tylenol and Advil a few doses within a day with temporary relief - Not tried heating pad No prior history of DJD arthritis or back surgery - Prior similar back pain flares improved with rest, this is lasting longer - Admits difficulty sleeping due to pain - Denies any fevers/chills, numbness, tingling, weakness, loss of control bladder/bowel incontinence or retention, unintentional wt loss, night sweats       10/28/2021    8:21 AM 09/16/2021    4:38 PM 09/16/2021    2:16 PM  Depression screen PHQ 2/9  Decreased Interest 0 0 0  Down, Depressed, Hopeless 0 0 0  PHQ - 2 Score 0 0 0  Altered sleeping 0 0   Tired, decreased energy 1 1   Change in appetite 0 1   Feeling bad or failure about yourself  0 0   Trouble concentrating 0 0   Moving slowly or fidgety/restless 0 0   Suicidal thoughts 0 0   PHQ-9 Score 1 2   Difficult doing work/chores Not difficult at all Not difficult at all     Social History   Tobacco Use   Smoking status: Never   Smokeless tobacco: Never  Vaping Use    Vaping Use: Former   Devices: only 2-3 months  Substance Use Topics   Alcohol use: Yes    Alcohol/week: 7.0 standard drinks of alcohol    Types: 7 Standard drinks or equivalent per week   Drug use: Never    Review of Systems Per HPI unless specifically indicated above     Objective:    BP (!) 154/83 (BP Location: Left Arm, Patient Position: Sitting, Cuff Size: Normal)   Pulse 94   Temp (!) 96.9 F (36.1 C) (Temporal)   Wt 289 lb (131.1 kg)   SpO2 98%   BMI 46.65 kg/m   Wt Readings from Last 3 Encounters:  04/20/22 289 lb (131.1 kg)  02/14/22 285 lb 6.4 oz (129.5 kg)  11/04/21 282 lb (127.9 kg)    Physical Exam Vitals and nursing note reviewed.  Constitutional:      General: She is not in acute distress.    Appearance: She is well-developed. She is obese. She is not diaphoretic.     Comments: Well-appearing, comfortable, cooperative  HENT:     Head: Normocephalic and atraumatic.  Eyes:     General:        Right eye: No discharge.        Left eye: No discharge.  Conjunctiva/sclera: Conjunctivae normal.  Neck:     Thyroid: No thyromegaly.  Cardiovascular:     Rate and Rhythm: Normal rate and regular rhythm.     Heart sounds: Normal heart sounds. No murmur heard. Pulmonary:     Effort: Pulmonary effort is normal. No respiratory distress.     Breath sounds: Normal breath sounds. No wheezing or rales.  Musculoskeletal:        General: Normal range of motion.     Cervical back: Normal range of motion and neck supple.     Comments: Seated straight leg raise with some reproduced pain R side. Back paraspinal lumbar muscles some hypertonicity but no significant reproducible pain. Non tender over pain.  Lymphadenopathy:     Cervical: No cervical adenopathy.  Skin:    General: Skin is warm and dry.     Findings: No erythema or rash.  Neurological:     Mental Status: She is alert and oriented to person, place, and time.  Psychiatric:        Behavior: Behavior normal.      Comments: Well groomed, good eye contact, normal speech and thoughts    Results for orders placed or performed in visit on 02/04/22  Lupus anticoagulant panel  Result Value Ref Range   PTT Lupus Anticoagulant 27.0 0.0 - 43.5 sec   DRVVT 49.1 (H) 0.0 - 47.0 sec   Lupus Anticoag Interp Comment:   CBC with Differential/Platelet  Result Value Ref Range   WBC 6.2 4.0 - 10.5 K/uL   RBC 5.14 (H) 3.87 - 5.11 MIL/uL   Hemoglobin 12.9 12.0 - 15.0 g/dL   HCT 40.2 36.0 - 46.0 %   MCV 78.2 (L) 80.0 - 100.0 fL   MCH 25.1 (L) 26.0 - 34.0 pg   MCHC 32.1 30.0 - 36.0 g/dL   RDW 14.7 11.5 - 15.5 %   Platelets 230 150 - 400 K/uL   nRBC 0.0 0.0 - 0.2 %   Neutrophils Relative % 61 %   Neutro Abs 3.8 1.7 - 7.7 K/uL   Lymphocytes Relative 28 %   Lymphs Abs 1.7 0.7 - 4.0 K/uL   Monocytes Relative 7 %   Monocytes Absolute 0.5 0.1 - 1.0 K/uL   Eosinophils Relative 3 %   Eosinophils Absolute 0.2 0.0 - 0.5 K/uL   Basophils Relative 1 %   Basophils Absolute 0.1 0.0 - 0.1 K/uL   Immature Granulocytes 0 %   Abs Immature Granulocytes 0.02 0.00 - 0.07 K/uL  Comprehensive metabolic panel  Result Value Ref Range   Sodium 135 135 - 145 mmol/L   Potassium 3.8 3.5 - 5.1 mmol/L   Chloride 101 98 - 111 mmol/L   CO2 28 22 - 32 mmol/L   Glucose, Bld 98 70 - 99 mg/dL   BUN 13 6 - 20 mg/dL   Creatinine, Ser 0.88 0.44 - 1.00 mg/dL   Calcium 8.8 (L) 8.9 - 10.3 mg/dL   Total Protein 7.4 6.5 - 8.1 g/dL   Albumin 4.3 3.5 - 5.0 g/dL   AST 19 15 - 41 U/L   ALT 20 0 - 44 U/L   Alkaline Phosphatase 59 38 - 126 U/L   Total Bilirubin 0.5 0.3 - 1.2 mg/dL   GFR, Estimated >60 >60 mL/min   Anion gap 6 5 - 15  dRVVT Mix  Result Value Ref Range   dRVVT Mix 44.9 (H) 0.0 - 40.4 sec  dRVVT Confirm  Result Value Ref Range   dRVVT Confirm  1.2 0.8 - 1.2 ratio      Assessment & Plan:   Problem List Items Addressed This Visit   None Visit Diagnoses     Acute right-sided low back pain with right-sided sciatica    -   Primary   Relevant Medications   baclofen (LIORESAL) 10 MG tablet   predniSONE (DELTASONE) 10 MG tablet       Acute on chronic R LBP with associated R sciatica. Suspect likely due to muscle spasm/strain, without known injury or trauma.  Frequent lifting at work - No red flag symptoms. Negative SLR for radiculopathy - Inadequate conservative therapy   Plan: 1. Avoid NSAID due to anticoagulation 2. Start muscle relaxant with Baclofen '10mg'$  tabs - take 5-'10mg'$  up to TID PRN, titrate up as tolerated caution sedation 3. May use Tylenol PRN for breakthrough 4. Encouraged use of heating pad 1-2x daily for now then PRN 5.  Follow-up 4-6 weeks if not improved for re-evaluation, consider X-ray imaging, trial of PT, and possibly referral to Orthopedic  Add Prednisone taper as back up plan only, reviewed precautions on steroid. Future consider Gabapentin as option  Meds ordered this encounter  Medications   baclofen (LIORESAL) 10 MG tablet    Sig: Take 0.5-1 tablets (5-10 mg total) by mouth 3 (three) times daily as needed for muscle spasms.    Dispense:  30 each    Refill:  1   predniSONE (DELTASONE) 10 MG tablet    Sig: Take 6 tabs with breakfast Day 1, 5 tabs Day 2, 4 tabs Day 3, 3 tabs Day 4, 2 tabs Day 5, 1 tab Day 6.    Dispense:  21 tablet    Refill:  0      Follow up plan: Return in about 4 weeks (around 05/18/2022), or if symptoms worsen or fail to improve, for 4-6 weeks as needed for back pain if not improved.  Nobie Putnam, [facility] 04/20/2022, 3:18 PM

## 2022-04-22 ENCOUNTER — Telehealth: Payer: BC Managed Care – PPO

## 2022-04-22 ENCOUNTER — Telehealth: Payer: Self-pay | Admitting: Pharmacist

## 2022-04-22 NOTE — Chronic Care Management (AMB) (Signed)
  Chronic Care Management   Outreach Note  04/22/2022 Name: Stacey Townsend MRN: [id number] DOB: [birth year]  I connected with [name] on 04/22/22 by telephone outreach and verified that I am speaking with the correct person using two identifiers.  Patient appearing on report for [place] Metric Hypertension Control due to last documented ambulatory blood pressure of 148/92 on 10/28/2021.   Note patient seen for Office Visit with Dr. Parks Ranger on 8/9 related to back pain.   Outreached patient to discuss hypertension control and medication management. Patient requests to reschedule our hypertension call as reports her schedule changed and she is currently at work.  Patient reports that she is taking acetaminophen as needed for pain as directed. Reports has not yet picked up/started baclofen Rx, but planning to pick up and start taking this today. Reinforce counseling on caution for sedation with baclofen and patient verbalizes understanding.   Follow Up Plan: CM Pharmacist will outreach to patient by telephone again on 04/29/2022 at 10:45 am  Wallace Cullens, PharmD, [facility] [phone]

## 2022-04-29 ENCOUNTER — Ambulatory Visit: Payer: BC Managed Care – PPO | Admitting: Pharmacist

## 2022-04-29 DIAGNOSIS — I10 Essential (primary) hypertension: Secondary | ICD-10-CM

## 2022-04-29 NOTE — Chronic Care Management (AMB) (Cosign Needed Addendum)
Chronic Care Management   Outreach Note  04/29/2022 Name: Stacey Townsend MRN: [id number] DOB: 1976/02/18  I connected with [name] on 04/29/22 by telephone outreach and verified that I am speaking with the correct person using two identifiers.  Patient appearing on report for [place] Metric Hypertension Control due to last documented ambulatory blood pressure of 154/83 on 04/20/2022. No next appointment with PCP is currently scheduled.   Outreached patient to discuss hypertension control and medication management.   Outpatient Encounter Medications as of 04/29/2022  Medication Sig   acetaminophen (TYLENOL) 500 MG tablet Take 500 mg by mouth every 8 (eight) hours as needed.   baclofen (LIORESAL) 10 MG tablet Take 0.5-1 tablets (5-10 mg total) by mouth 3 (three) times daily as needed for muscle spasms.   lisinopril-hydrochlorothiazide (ZESTORETIC) 20-25 MG tablet Take 0.5 tablets by mouth daily.   lovastatin (MEVACOR) 10 MG tablet Take 1 tablet (10 mg total) by mouth at bedtime.   albuterol (VENTOLIN HFA) 108 (90 Base) MCG/ACT inhaler Inhale 2 puffs into the lungs every 4 (four) hours as needed for wheezing or shortness of breath.   apixaban (ELIQUIS) 2.5 MG TABS tablet Take 1 tablet (2.5 mg total) by mouth 2 (two) times daily.   cetirizine (ZYRTEC) 10 MG tablet Take 10 mg by mouth daily as needed.   ciclesonide (ALVESCO) 80 MCG/ACT inhaler Inhale 1 puff into the lungs 2 (two) times daily.   predniSONE (DELTASONE) 10 MG tablet Take 6 tabs with breakfast Day 1, 5 tabs Day 2, 4 tabs Day 3, 3 tabs Day 4, 2 tabs Day 5, 1 tab Day 6. (Patient not taking: Reported on 04/29/2022)   No facility-administered encounter medications on file as of 04/29/2022.    Lab Results  Component Value Date   CREATININE 0.88 02/04/2022   BUN 13 02/04/2022   NA 135 02/04/2022   K 3.8 02/04/2022   CL 101 02/04/2022   CO2 28 02/04/2022    BP Readings from Last 3 Encounters:  04/20/22 (!)  154/83  02/14/22 (!) 144/89  11/04/21 (!) 141/94    Pulse Readings from Last 3 Encounters:  04/20/22 94  02/14/22 81  11/04/21 78    Current medications: lisinopril-hydrochlorothiazide 20-25 mg -  tablet daily Discuss importance of medication adherence.  Reports daughter assisting to remind patient. Encourage patient to use twice daily phone alarm as adherence aid   Home Monitoring: Patient does not have an automated upper arm home BP machine Encourage patient to obtain an upper arm home blood pressure monitor. Reports planning to obtain an upper arm BP monitor today. Agree to follow up in 2 weeks to review readings  Reports her back pain is now significantly improved since office visit with Dr. Parks Ranger. She currently using acetaminophen as needed during the day and baclofen as needed at bedtime. Denies needing to use prednisone back up plan from provider.   Reports has been working on reducing her salt intake Encourage patient to review nutrition labels and limit salt/sodium intake   Current physical activity: reports stays active throughout the day at work x 5 days/week   Assessment/Plan: - Currently uncontrolled - Reviewed to check blood pressure daily, document, and provide at next provider visit - Discussed dietary modifications, such as reduced salt intake, focus on whole grains, vegetables, lean proteins - Reviewed strategies to improve medication adherence such as using daily phone alarms - From review of chart, note PCP advised patient to return to office ~5/16 for recheck of  lipid panel and ~04/27/2022 for follow up office Visit. Again recommend patient contact office today to schedule lab appointment and follow up office visit   Follow Up Plan: CM Pharmacist will outreach to patient by telephone again on 05/13/2022 at 10 am  Wallace Cullens, PharmD, [place] [phone]

## 2022-04-29 NOTE — Patient Instructions (Signed)
Check your blood pressure once daily, and any time you have concerning symptoms like headache, chest pain, dizziness, shortness of breath, or vision changes.   Our goal is less than 130/80.  To appropriately check your blood pressure, make sure you do the following:  1) Avoid caffeine, exercise, or tobacco products for 30 minutes before checking. Empty your bladder. 2) Sit with your back supported in a flat-backed chair. Rest your arm on something flat (arm of the chair, table, etc). 3) Sit still with your feet flat on the floor, resting, for at least 5 minutes.  4) Check your blood pressure. Take 1-2 readings.  5) Write down these readings and bring with you to any provider appointments.  Bring your home blood pressure machine with you to a provider's office for accuracy comparison at least once a year.   Make sure you take your blood pressure medications before you come to any office visit, even if you were asked to fast for labs.  Keyli Duross Venda Dice, PharmD, BCACP Clinical Pharmacist [facility] [place] [phone]  

## 2022-05-13 ENCOUNTER — Ambulatory Visit: Payer: BC Managed Care – PPO | Admitting: Pharmacist

## 2022-05-13 ENCOUNTER — Other Ambulatory Visit: Payer: BC Managed Care – PPO

## 2022-05-13 DIAGNOSIS — I10 Essential (primary) hypertension: Secondary | ICD-10-CM

## 2022-05-13 NOTE — Patient Instructions (Signed)
Check your blood pressure once daily, and any time you have concerning symptoms like headache, chest pain, dizziness, shortness of breath, or vision changes.   Our goal is less than 130/80.  To appropriately check your blood pressure, make sure you do the following:  1) Avoid caffeine, exercise, or tobacco products for 30 minutes before checking. Empty your bladder. 2) Sit with your back supported in a flat-backed chair. Rest your arm on something flat (arm of the chair, table, etc). 3) Sit still with your feet flat on the floor, resting, for at least 5 minutes.  4) Check your blood pressure. Take 1-2 readings.  5) Write down these readings and bring with you to any provider appointments.  Bring your home blood pressure machine with you to a provider's office for accuracy comparison at least once a year.   Make sure you take your blood pressure medications before you come to any office visit, even if you were asked to fast for labs.  Jhoana Upham Eshani Maestre, PharmD, BCACP Clinical Pharmacist [facility]  [phone]  

## 2022-05-13 NOTE — Chronic Care Management (AMB) (Signed)
Chronic Care Management   Outreach Note  05/13/2022 Name: Stacey Townsend MRN: [id number] DOB: 10-17-1975  I connected with Stacey Townsend on 05/13/22 by telephone outreach and verified that I am speaking with the correct person using two identifiers.  Patient appearing on report for [place] Metric Hypertension Control due to last documented ambulatory blood pressure of 154/83 on 04/20/2022. No next appointment with PCP is currently scheduled.   Outreached patient to discuss hypertension control and medication management.   Outpatient Encounter Medications as of 05/13/2022  Medication Sig   lisinopril-hydrochlorothiazide (ZESTORETIC) 20-25 MG tablet Take 0.5 tablets by mouth daily.   acetaminophen (TYLENOL) 500 MG tablet Take 500 mg by mouth every 8 (eight) hours as needed.   albuterol (VENTOLIN HFA) 108 (90 Base) MCG/ACT inhaler Inhale 2 puffs into the lungs every 4 (four) hours as needed for wheezing or shortness of breath.   apixaban (ELIQUIS) 2.5 MG TABS tablet Take 1 tablet (2.5 mg total) by mouth 2 (two) times daily.   baclofen (LIORESAL) 10 MG tablet Take 0.5-1 tablets (5-10 mg total) by mouth 3 (three) times daily as needed for muscle spasms.   cetirizine (ZYRTEC) 10 MG tablet Take 10 mg by mouth daily as needed.   ciclesonide (ALVESCO) 80 MCG/ACT inhaler Inhale 1 puff into the lungs 2 (two) times daily.   lovastatin (MEVACOR) 10 MG tablet Take 1 tablet (10 mg total) by mouth at bedtime.   [DISCONTINUED] predniSONE (DELTASONE) 10 MG tablet Take 6 tabs with breakfast Day 1, 5 tabs Day 2, 4 tabs Day 3, 3 tabs Day 4, 2 tabs Day 5, 1 tab Day 6. (Patient not taking: Reported on 04/29/2022)   No facility-administered encounter medications on file as of 05/13/2022.    Lab Results  Component Value Date   CREATININE 0.88 02/04/2022   BUN 13 02/04/2022   NA 135 02/04/2022   K 3.8 02/04/2022   CL 101 02/04/2022   CO2 28 02/04/2022    BP Readings from Last 3 Encounters:   04/20/22 (!) 154/83  02/14/22 (!) 144/89  11/04/21 (!) 141/94    Pulse Readings from Last 3 Encounters:  04/20/22 94  02/14/22 81  11/04/21 78    Current medications: lisinopril-hydrochlorothiazide 20-25 mg -  tablet daily Denies missed doses Reports daughter assisting to remind patient. Have encouraged patient to use twice daily phone alarm as adherence aid   Home Monitoring: Patient does not have an automated upper arm home BP machine Again encourage patient to obtain an upper arm home blood pressure monitor. Reports will order a monitor off of [employer] today Will mail patient a blood pressure log and educational handout as requested    Reports has reduced her salt intake Encourage patient to continue to review nutrition labels and limit salt/sodium intake   Current physical activity: reports stays active throughout the day at work x 5 days/week   Assessment/Plan: - Currently uncontrolled - Reviewed to check blood pressure daily, document, and provide at next provider visit - Discussed dietary modifications, such as reduced salt intake, focus on whole grains, vegetables, lean proteins - Reviewed strategies to improve medication adherence such as using daily phone alarms - From review of chart, note PCP advised patient to return to office ~5/16 for recheck of lipid panel and ~04/27/2022 for follow up office Visit. Again recommend patient contact office today to schedule lab appointment and follow up office visit    Follow Up Plan: CM Pharmacist will outreach to patient by telephone again  on 06/24/2022 at 10 am  Wallace Cullens, PharmD, [facility] [place] [phone]

## 2022-06-02 ENCOUNTER — Encounter: Payer: Self-pay | Admitting: Internal Medicine

## 2022-06-02 ENCOUNTER — Ambulatory Visit: Payer: BC Managed Care – PPO | Admitting: Internal Medicine

## 2022-06-02 VITALS — BP 146/70 | HR 88 | Temp 98.6°F | Wt 296.0 lb

## 2022-06-02 DIAGNOSIS — E7800 Pure hypercholesterolemia, unspecified: Secondary | ICD-10-CM

## 2022-06-02 DIAGNOSIS — J4540 Moderate persistent asthma, uncomplicated: Secondary | ICD-10-CM | POA: Diagnosis not present

## 2022-06-02 DIAGNOSIS — G8929 Other chronic pain: Secondary | ICD-10-CM

## 2022-06-02 DIAGNOSIS — I10 Essential (primary) hypertension: Secondary | ICD-10-CM

## 2022-06-02 DIAGNOSIS — M5441 Lumbago with sciatica, right side: Secondary | ICD-10-CM

## 2022-06-02 DIAGNOSIS — Z86718 Personal history of other venous thrombosis and embolism: Secondary | ICD-10-CM

## 2022-06-02 DIAGNOSIS — R7303 Prediabetes: Secondary | ICD-10-CM | POA: Diagnosis not present

## 2022-06-02 DIAGNOSIS — M25562 Pain in left knee: Secondary | ICD-10-CM

## 2022-06-02 MED ORDER — PREDNISONE 10 MG PO TABS: ORAL_TABLET | ORAL | 0 refills | Status: DC

## 2022-06-02 MED ORDER — LISINOPRIL-HYDROCHLOROTHIAZIDE 20-25 MG PO TABS: 1.0000 | ORAL_TABLET | Freq: Every day | ORAL | 0 refills | Status: DC

## 2022-06-02 NOTE — Assessment & Plan Note (Signed)
Encourage weight loss as this can help reduce joint pain Continue Tylenol arthritis and baclofen as needed

## 2022-06-02 NOTE — Assessment & Plan Note (Signed)
Continue ciclesonide and albuterol

## 2022-06-02 NOTE — Assessment & Plan Note (Signed)
A1c today Urged low-carb diet and exercise for weight loss

## 2022-06-02 NOTE — Assessment & Plan Note (Signed)
Encourage diet and exercise for weight loss 

## 2022-06-02 NOTE — Progress Notes (Signed)
Subjective:    Patient ID: Stacey Townsend, female    DOB: [birth year], 46 y.o.   MRN: [id number]  HPI  Patient presents to clinic today for follow-up of chronic conditions.  Asthma: Moderate, persistent.  She is taking Ciclesonide and Albuterol as prescribed.  There are no PFTs on file.  HTN: Her BP today is 146/70.  She is taking Lisinopril HCT as prescribed.  There is no ECG on file.  History of Left DVT, Family History of Clotting Disorder: Managed with Eliquis.  She follows with hematology.  Chronic Left Knee Pain: She is taking Tylenol Arthritis and Baclofen as prescribed.  She does not follow with orthopedics.  HLD: Her last LDL was 146, triglycerides 90, 01/2022.  She denies myalgias on Lovastatin.  She has been trying to consume a low-fat diet.  Prediabetes: Her last A1c was 5.9%, 10/2021.  She is not taking any oral diabetic medications time.  She does not check her sugars.  She also reports right leg pain. This started 5 days ago. She describes the pain as pinching and throbbing. She denies numbness, tingling or weakness. She has a history of chronic back pain. She has tried muscle relaxers with minimal relief of symptoms.  Review of Systems     Past Medical History:  Diagnosis Date   Asthma    Family history of clotting disorder    History of DVT (deep vein thrombosis)    Hypertension    Osteoarthritis, knee    Supraventricular tachycardia, paroxysmal ([place]) 2017    Current Outpatient Medications  Medication Sig Dispense Refill   acetaminophen (TYLENOL) 500 MG tablet Take 500 mg by mouth every 8 (eight) hours as needed.     albuterol (VENTOLIN HFA) 108 (90 Base) MCG/ACT inhaler Inhale 2 puffs into the lungs every 4 (four) hours as needed for wheezing or shortness of breath. 1 each 5   apixaban (ELIQUIS) 2.5 MG TABS tablet Take 1 tablet (2.5 mg total) by mouth 2 (two) times daily. 60 tablet 5   baclofen (LIORESAL) 10 MG tablet Take 0.5-1 tablets (5-10 mg total)  by mouth 3 (three) times daily as needed for muscle spasms. 30 each 1   cetirizine (ZYRTEC) 10 MG tablet Take 10 mg by mouth daily as needed.     ciclesonide (ALVESCO) 80 MCG/ACT inhaler Inhale 1 puff into the lungs 2 (two) times daily.     lisinopril-hydrochlorothiazide (ZESTORETIC) 20-25 MG tablet Take 0.5 tablets by mouth daily. 45 tablet 1   lovastatin (MEVACOR) 10 MG tablet Take 1 tablet (10 mg total) by mouth at bedtime. 30 tablet 2   No current facility-administered medications for this visit.    Allergies  Allergen Reactions   Penicillins Shortness Of Breath    Hives and vomitting    Family History  Problem Relation Age of Onset   Deep vein thrombosis Mother    Thyroid disease Mother    Hypertension Father    Diabetes Father    Deep vein thrombosis Sister    Thyroid disease Sister    Hypertension Sister    Healthy Sister    Breast cancer Paternal Grandmother    Deep vein thrombosis Maternal Uncle    Deep vein thrombosis Paternal Aunt    Breast cancer Paternal Aunt    Deep vein thrombosis Paternal Uncle     Social History   Socioeconomic History   Marital status: Single    Spouse name: Not on file   Number of children: Not on file  Years of education: Not on file   Highest education level: Not on file  Occupational History   Not on file  Tobacco Use   Smoking status: Never   Smokeless tobacco: Never  Vaping Use   Vaping Use: Former   Devices: only 2-3 months  Substance and Sexual Activity   Alcohol use: Yes    Alcohol/week: 7.0 standard drinks of alcohol    Types: 7 Standard drinks or equivalent per week   Drug use: Never   Sexual activity: Yes  Other Topics Concern   Not on file  Social History Narrative   Not on file   Social Determinants of Health   Financial Resource Strain: Not on file  Food Insecurity: Not on file  Transportation Needs: Not on file  Physical Activity: Not on file  Stress: Not on file  Social Connections: Not on file   Intimate Partner Violence: Not on file     Constitutional: Denies fever, malaise, fatigue, headache or abrupt weight changes.  HEENT: Denies eye pain, eye redness, ear pain, ringing in the ears, wax buildup, runny nose, nasal congestion, bloody nose, or sore throat. Respiratory: Denies difficulty breathing, shortness of breath, cough or sputum production.   Cardiovascular: Denies chest pain, chest tightness, palpitations or swelling in the hands or feet.  Gastrointestinal: Denies abdominal pain, bloating, constipation, diarrhea or blood in the stool.  GU: Denies urgency, frequency, pain with urination, burning sensation, blood in urine, odor or discharge. Musculoskeletal: Pain reports chronic low back pain, chronic left knee pain.  Denies decrease in range of motion, difficulty with gait, muscle pain or joint swelling.  Skin: Denies redness, rashes, lesions or ulcercations.  Neurological: Denies dizziness, difficulty with memory, difficulty with speech or problems with balance and coordination.  Psych: Denies anxiety, depression, SI/HI.  No other specific complaints in a complete review of systems (except as listed in HPI above).  Objective:   Physical Exam  BP (!) 146/70 (BP Location: Left Arm, Patient Position: Sitting, Cuff Size: Large)   Pulse 88   Temp 98.6 F (37 C) (Temporal)   Wt 296 lb (134.3 kg)   SpO2 100%   BMI 47.78 kg/m   Wt Readings from Last 3 Encounters:  04/20/22 289 lb (131.1 kg)  02/14/22 285 lb 6.4 oz (129.5 kg)  11/04/21 282 lb (127.9 kg)    General: Appears her stated age, the skin in NAD. Skin: Warm, dry and intact.  HEENT: Head: normal shape and size; Eyes: sclera white, no icterus, conjunctiva pink, PERRLA and EOMs intact;  Cardiovascular: Normal rate and rhythm. S1,S2 noted.  No murmur, rubs or gallops noted.  Trace pitting BLE edema.  Pulmonary/Chest: Normal effort and positive vesicular breath sounds. No respiratory distress. No wheezes, rales or  ronchi noted.  Musculoskeletal: Normal flexion, extension, rotation and lateral bending of the spine.  Pain with palpation over the lumbar spine.  Strength 5/5 BLE.  Able to stand on tiptoes and heels.  Limping gait.  Neurological: Alert and oriented.  Positive SLR on the right at 30 degrees. Psychiatric: Mood and affect normal. Behavior is normal. Judgment and thought content normal.    BMET    Component Value Date/Time   NA 135 02/04/2022 0940   K 3.8 02/04/2022 0940   CL 101 02/04/2022 0940   CO2 28 02/04/2022 0940   GLUCOSE 98 02/04/2022 0940   BUN 13 02/04/2022 0940   CREATININE 0.88 02/04/2022 0940   CALCIUM 8.8 (L) 02/04/2022 0940   GFRNONAA >60  02/04/2022 0940    Lipid Panel     Component Value Date/Time   CHOL 222 (H) 10/28/2021 0836   TRIG 90 10/28/2021 0836   HDL 56 10/28/2021 0836   CHOLHDL 4.0 10/28/2021 0836   LDLCALC 146 (H) 10/28/2021 0836    CBC    Component Value Date/Time   WBC 6.2 02/04/2022 0940   RBC 5.14 (H) 02/04/2022 0940   HGB 12.9 02/04/2022 0940   HCT 40.2 02/04/2022 0940   PLT 230 02/04/2022 0940   MCV 78.2 (L) 02/04/2022 0940   MCH 25.1 (L) 02/04/2022 0940   MCHC 32.1 02/04/2022 0940   RDW 14.7 02/04/2022 0940   LYMPHSABS 1.7 02/04/2022 0940   MONOABS 0.5 02/04/2022 0940   EOSABS 0.2 02/04/2022 0940   BASOSABS 0.1 02/04/2022 0940    Hgb A1C Lab Results  Component Value Date   HGBA1C 5.9 (H) 10/28/2021            Assessment & Plan:  Chronic Low Back Pain with Right-Sided Sciatica:  Rx for Pred taper x9 days Encourage ice and stretching Continue muscle relaxer as previously prescribed Can do x-ray lumbar spine if symptoms persist or worsen   RTC in 2 weeks for follow-up of HTN, 6 months for your annual exam Webb Silversmith, NP

## 2022-06-02 NOTE — Assessment & Plan Note (Signed)
Uncontrolled Increase lisinopril HCT to 1 tab daily Reinforced DASH diet and exercise weight loss C-Met today

## 2022-06-02 NOTE — Patient Instructions (Signed)
Sciatica  Sciatica is pain, weakness, tingling, or loss of feeling (numbness) along the sciatic nerve. The sciatic nerve starts in the lower back and goes down the back of each leg. Sciatica usually affects one side of the body. Sciatica usually goes away on its own or with treatment. Sometimes, sciatica may come back. What are the causes? This condition happens when the sciatic nerve is pinched or has pressure put on it. This may be caused by: A disk in between the bones of the spine bulging out too far (herniated disk). Changes in the spinal disks due to aging. A condition that affects a muscle in the butt. Extra bone growth near the sciatic nerve. A break (fracture) of the area between your hip bones (pelvis). Pregnancy. Tumor. This is rare. What increases the risk? You are more likely to develop this condition if you: Play sports that put pressure or stress on the spine. Have poor strength and ease of movement (flexibility). Have had a back injury or back surgery. Sit for long periods of time. Do activities that involve bending or lifting over and over again. Are very overweight (obese). What are the signs or symptoms? Symptoms can vary from mild to very bad. They may include: Any of these problems in the lower back, leg, hip, or butt: Mild tingling, loss of feeling, or dull aches. A burning feeling. Sharp pains. Loss of feeling in the back of the calf or the sole of the foot. Leg weakness. Very bad back pain that makes it hard to move. These symptoms may get worse when you cough, sneeze, or laugh. They may also get worse when you sit or stand for long periods of time. How is this treated? This condition often gets better without any treatment. However, treatment may include: Changing or cutting back on physical activity when you have pain. Exercising, including strengthening and stretching. Putting ice or heat on the affected area. Shots of medicines to relieve pain and  swelling or to relax your muscles. Surgery. Follow these instructions at home: Medicines Take over-the-counter and prescription medicines only as told by your doctor. Ask your doctor if you should avoid driving or using machines while you are taking your medicine. Managing pain     If told, put ice on the affected area. To do this: Put ice in a plastic bag. Place a towel between your skin and the bag. Leave the ice on for 20 minutes, 2-3 times a day. If your skin turns bright red, take off the ice right away to prevent skin damage. The risk of skin damage is higher if you cannot feel pain, heat, or cold. If told, put heat on the affected area. Do this as often as told by your doctor. Use the heat source that your doctor tells you to use, such as a moist heat pack or a heating pad. Place a towel between your skin and the heat source. Leave the heat on for 20-30 minutes. If your skin turns bright red, take off the heat right away to prevent burns. The risk of burns is higher if you cannot feel pain, heat, or cold. Activity  Return to your normal activities when your doctor says that it is safe. Avoid activities that make your symptoms worse. Take short rests during the day. When you rest for a long time, do some physical activity or stretching between periods of rest. Avoid sitting for a long time without moving. Get up and move around at least one time each   hour. Do exercises and stretches as told by your doctor. Do not lift anything that is heavier than 10 lb (4.5 kg). Avoid lifting heavy things even when you do not have symptoms. Avoid lifting heavy things over and over. When you lift objects, always lift in a way that is safe for your body. To do this, you should: Bend your knees. Keep the object close to your body. Avoid twisting. General instructions Stay at a healthy weight. Wear comfortable shoes that support your feet. Avoid wearing high heels. Avoid sleeping on a mattress  that is too soft or too hard. You might have less pain if you sleep on a mattress that is firm enough to support your back. Contact a doctor if: Your pain is not controlled by medicine. Your pain does not get better. Your pain gets worse. Your pain lasts longer than 4 weeks. You lose weight without trying. Get help right away if: You cannot control when you pee (urinate) or poop (have a bowel movement). You have weakness in any of these areas and it gets worse: Lower back. The area between your hip bones. Butt. Legs. You have redness or swelling of your back. You have a burning feeling when you pee. Summary Sciatica is pain, weakness, tingling, or loss of feeling (numbness) along the sciatic nerve. This may include the lower back, legs, hips, and butt. This condition happens when the sciatic nerve is pinched or has pressure put on it. Treatment often includes rest, exercise, medicines, and putting ice or heat on the affected area. This information is not intended to replace advice given to you by your health care provider. Make sure you discuss any questions you have with your health care provider. Document Revised: 12/06/2021 Document Reviewed: 12/06/2021 Elsevier Patient Education  2023 Else[employer] Inc.  

## 2022-06-02 NOTE — Assessment & Plan Note (Signed)
C-Met and lipid profile today Her to consume a low-fat diet Continue lovastatin

## 2022-06-02 NOTE — Assessment & Plan Note (Signed)
Continue Eliquis She will continue to follow with hematology

## 2022-06-03 LAB — COMPLETE METABOLIC PANEL WITH GFR
AG Ratio: 1.7 (calc) (ref 1.0–2.5)
ALT: 18 U/L (ref 6–29)
AST: 15 U/L (ref 10–35)
Albumin: 4.2 g/dL (ref 3.6–5.1)
Alkaline phosphatase (APISO): 57 U/L (ref 31–125)
BUN/Creatinine Ratio: 12 (calc) (ref 6–22)
BUN: 14 mg/dL (ref 7–25)
CO2: 30 mmol/L (ref 20–32)
Calcium: 9.1 mg/dL (ref 8.6–10.2)
Chloride: 101 mmol/L (ref 98–110)
Creat: 1.19 mg/dL — ABNORMAL HIGH (ref 0.50–0.99)
Globulin: 2.5 g/dL (calc) (ref 1.9–3.7)
Glucose, Bld: 95 mg/dL (ref 65–99)
Potassium: 4 mmol/L (ref 3.5–5.3)
Sodium: 137 mmol/L (ref 135–146)
Total Bilirubin: 0.2 mg/dL (ref 0.2–1.2)
Total Protein: 6.7 g/dL (ref 6.1–8.1)
eGFR: 57 mL/min/{1.73_m2} — ABNORMAL LOW (ref >=60)

## 2022-06-03 LAB — HEMOGLOBIN A1C
Hgb A1c MFr Bld: 6 % of total Hgb — ABNORMAL HIGH (ref <5.7)
Mean Plasma Glucose: 126 mg/dL
eAG (mmol/L): 7 mmol/L

## 2022-06-03 LAB — LIPID PANEL
Cholesterol: 202 mg/dL — ABNORMAL HIGH (ref <200)
HDL: 56 mg/dL (ref >=50)
LDL Cholesterol (Calc): 123 mg/dL (calc) — ABNORMAL HIGH
Non-HDL Cholesterol (Calc): 146 mg/dL (calc) — ABNORMAL HIGH (ref <130)
Total CHOL/HDL Ratio: 3.6 (calc) (ref <5.0)
Triglycerides: 119 mg/dL (ref <150)

## 2022-06-24 ENCOUNTER — Ambulatory Visit: Payer: BC Managed Care – PPO | Admitting: Pharmacist

## 2022-06-24 DIAGNOSIS — I10 Essential (primary) hypertension: Secondary | ICD-10-CM

## 2022-06-24 NOTE — Chronic Care Management (AMB) (Signed)
Chronic Care Management   Outreach Note  06/24/2022 Name: Stacey Townsend MRN: [id number] DOB: [birth year]  I connected with Stacey Townsend on 06/24/22 by telephone outreach and verified that I am speaking with the correct person using two identifiers.  Patient appearing on report for [place] Metric Hypertension Control due to last documented ambulatory blood pressure of 146/70 on 06/02/2022. No next appointment with PCP is currently scheduled.   From review of chart, note patient seen for Office Visit with PCP on 9/21 BP 146/70 Provider advised: - Related to chronic low back pain,   Rx for Pred taper x9 days Encourage ice and stretching Continue muscle relaxer as previously prescribed  - For blood pressure, increase of lisinopril-HCTZ 20-25 mg to 1 tablet daily and return to clinic in ~2 weeks for follow up  Outreached patient to discuss hypertension control and medication management.   Outpatient Encounter Medications as of 06/24/2022  Medication Sig   acetaminophen (TYLENOL) 500 MG tablet Take 500 mg by mouth every 8 (eight) hours as needed.   albuterol (VENTOLIN HFA) 108 (90 Base) MCG/ACT inhaler Inhale 2 puffs into the lungs every 4 (four) hours as needed for wheezing or shortness of breath.   apixaban (ELIQUIS) 2.5 MG TABS tablet Take 1 tablet (2.5 mg total) by mouth 2 (two) times daily.   baclofen (LIORESAL) 10 MG tablet Take 0.5-1 tablets (5-10 mg total) by mouth 3 (three) times daily as needed for muscle spasms.   cetirizine (ZYRTEC) 10 MG tablet Take 10 mg by mouth daily as needed.   ciclesonide (ALVESCO) 80 MCG/ACT inhaler Inhale 1 puff into the lungs 2 (two) times daily.   lisinopril-hydrochlorothiazide (ZESTORETIC) 20-25 MG tablet Take 1 tablet by mouth daily.   lovastatin (MEVACOR) 10 MG tablet Take 1 tablet (10 mg total) by mouth at bedtime.   predniSONE (DELTASONE) 10 MG tablet Take 3 tabs on days 1-3, 2 tabs on days 4-6, 1 tab on days 7-9   No  facility-administered encounter medications on file as of 06/24/2022.    Lab Results  Component Value Date   CREATININE 1.19 (H) 06/02/2022   BUN 14 06/02/2022   NA 137 06/02/2022   K 4.0 06/02/2022   CL 101 06/02/2022   CO2 30 06/02/2022    BP Readings from Last 3 Encounters:  06/02/22 (!) 146/70  04/20/22 (!) 154/83  02/14/22 (!) 144/89    Pulse Readings from Last 3 Encounters:  06/02/22 88  04/20/22 94  02/14/22 81    Today patient reports continues to have lower back pain, but pain improved with completing 9 day course of prednisone as ordered by PCP. Reports also using ice and stretching as recommended. Reports uses baclofen as needed as prescribed in evenings only   Current medications: lisinopril-hydrochlorothiazide 20-25 mg - 1 tablet daily Denies missed doses Reports now using twice daily phone alarm as adherence aid   Home Monitoring: Patient does not have an automated upper arm home BP machine Again encourage patient to obtain an upper arm home blood pressure monitor. Patient denies monitoring home blood pressure currently  Denies symptoms of hypotension or hypertension  Reports has not scheduled follow up hypertension visit with PCP    Reports has reduced her salt intake Encourage patient to continue to review nutrition labels and limit salt/sodium intake   Current physical activity: reports stays active throughout the day at work x 5 days/week   Assessment/Plan: - Currently uncontrolled - Reviewed appropriate administration of medication regimen - Recommend  to obtain home upper arm blood pressure monitor, and start to check blood pressure, document, and provide at next provider visit - Discussed dietary modifications, such as reduced salt intake, focus on whole grains, vegetables, lean proteins - Recommend patient contact office to schedule HTN follow up appointment with PCP   Follow Up Plan: CM Pharmacist will outreach to patient by telephone again  on 07/22/2022 at 10:30 am  Wallace Cullens, PharmD, [place] [phone]

## 2022-06-24 NOTE — Patient Instructions (Signed)
Check your blood pressure once daily, and any time you have concerning symptoms like headache, chest pain, dizziness, shortness of breath, or vision changes.   Our goal is less than 130/80.  To appropriately check your blood pressure, make sure you do the following:  1) Avoid caffeine, exercise, or tobacco products for 30 minutes before checking. Empty your bladder. 2) Sit with your back supported in a flat-backed chair. Rest your arm on something flat (arm of the chair, table, etc). 3) Sit still with your feet flat on the floor, resting, for at least 5 minutes.  4) Check your blood pressure. Take 1-2 readings.  5) Write down these readings and bring with you to any provider appointments.  Bring your home blood pressure machine with you to a provider's office for accuracy comparison at least once a year.   Make sure you take your blood pressure medications before you come to any office visit, even if you were asked to fast for labs.  Katelynn Heidler Caralee Morea, PharmD, BCACP Clinical Pharmacist [facility] [place] [phone]  

## 2022-07-15 ENCOUNTER — Other Ambulatory Visit: Payer: Self-pay | Admitting: Internal Medicine

## 2022-07-15 ENCOUNTER — Ambulatory Visit: Payer: BC Managed Care – PPO | Admitting: Pharmacist

## 2022-07-15 ENCOUNTER — Telehealth: Payer: Self-pay | Admitting: Pharmacist

## 2022-07-15 DIAGNOSIS — E782 Mixed hyperlipidemia: Secondary | ICD-10-CM

## 2022-07-15 DIAGNOSIS — I10 Essential (primary) hypertension: Secondary | ICD-10-CM

## 2022-07-15 MED ORDER — LOVASTATIN 10 MG PO TABS: 10.0000 mg | ORAL_TABLET | Freq: Every day | ORAL | 2 refills | Status: DC

## 2022-07-15 NOTE — Chronic Care Management (AMB) (Signed)
Chronic Care Management   Outreach Note  07/15/2022 Name: Stacey Townsend MRN: [id number] DOB: [birth year]  I connected with Stacey Townsend on 07/15/22 by telephone outreach and verified that I am speaking with the correct person using two identifiers.  Patient appearing on report for [place] Metric Hypertension Control due to last documented ambulatory blood pressure of 146/70 on 06/02/2022. No next appointment with PCP is currently scheduled.    From review of chart, note patient seen for Office Visit with PCP on 9/21 BP 146/70 Provider advised: - Related to chronic low back pain,   Rx for Pred taper x9 days Encourage ice and stretching Continue muscle relaxer as previously prescribed   - For blood pressure, increase of lisinopril-HCTZ 20-25 mg to 1 tablet daily and return to clinic in ~2 weeks for follow up  Outreached patient to discuss hypertension control and medication management.   Outpatient Encounter Medications as of 07/15/2022  Medication Sig   acetaminophen (TYLENOL) 500 MG tablet Take 500 mg by mouth every 8 (eight) hours as needed.   albuterol (VENTOLIN HFA) 108 (90 Base) MCG/ACT inhaler Inhale 2 puffs into the lungs every 4 (four) hours as needed for wheezing or shortness of breath.   apixaban (ELIQUIS) 2.5 MG TABS tablet Take 1 tablet (2.5 mg total) by mouth 2 (two) times daily.   baclofen (LIORESAL) 10 MG tablet Take 0.5-1 tablets (5-10 mg total) by mouth 3 (three) times daily as needed for muscle spasms.   cetirizine (ZYRTEC) 10 MG tablet Take 10 mg by mouth daily as needed.   ciclesonide (ALVESCO) 80 MCG/ACT inhaler Inhale 1 puff into the lungs 2 (two) times daily.   lisinopril-hydrochlorothiazide (ZESTORETIC) 20-25 MG tablet Take 1 tablet by mouth daily.   lovastatin (MEVACOR) 10 MG tablet Take 1 tablet (10 mg total) by mouth at bedtime.   No facility-administered encounter medications on file as of 07/15/2022.    Lab Results  Component Value Date    CREATININE 1.19 (H) 06/02/2022   BUN 14 06/02/2022   NA 137 06/02/2022   K 4.0 06/02/2022   CL 101 06/02/2022   CO2 30 06/02/2022    BP Readings from Last 3 Encounters:  06/02/22 (!) 146/70  04/20/22 (!) 154/83  02/14/22 (!) 144/89    Pulse Readings from Last 3 Encounters:  06/02/22 88  04/20/22 94  02/14/22 81    Current medications: lisinopril-hydrochlorothiazide 20-25 mg - 1 tablet daily Note patient uses twice daily phone alarm as adherence aid   Home Monitoring: Patient does not have an automated upper arm home BP machine Again encourage patient to obtain an upper arm home blood pressure monitor. Patient denies monitoring home blood pressure currently   Reports has not yet scheduled follow up hypertension visit with PCP     Reports has reduced her salt intake Encourage patient to continue to review nutrition labels and limit salt/sodium intake   Current physical activity: reports stays active throughout the day at work x 5 days/week. Reports now also gradually starting walking/resting/stretching for ~2 hours x 2-3 days   Assessment/Plan: - Currently uncontrolled - Reviewed appropriate administration of medication regimen - Counsel on importance of medication adherence. Patient to follow up with pharmacy for medication refills prior to running out of medications - Recommend to obtain home upper arm blood pressure monitor, and start to check blood pressure, document, and provide at next provider visit - Discussed dietary modifications, such as reduced salt intake - Again recommend patient contact office to  schedule HTN follow up appointment with PCP - Will collaborate with clinic team as patient requests renewal of lovastatin Rx   Follow Up Plan: CM Pharmacist will outreach to patient by telephone again on 08/26/2022 at 11:45 am  Wallace Cullens, PharmD, [facility] [place] [phone]

## 2022-07-15 NOTE — Patient Instructions (Signed)
Check your blood pressure once daily, and any time you have concerning symptoms like headache, chest pain, dizziness, shortness of breath, or vision changes.   Our goal is less than 130/80.  To appropriately check your blood pressure, make sure you do the following:  1) Avoid caffeine, exercise, or tobacco products for 30 minutes before checking. Empty your bladder. 2) Sit with your back supported in a flat-backed chair. Rest your arm on something flat (arm of the chair, table, etc). 3) Sit still with your feet flat on the floor, resting, for at least 5 minutes.  4) Check your blood pressure. Take 1-2 readings.  5) Write down these readings and bring with you to any provider appointments.  Bring your home blood pressure machine with you to a provider's office for accuracy comparison at least once a year.   Make sure you take your blood pressure medications before you come to any office visit, even if you were asked to fast for labs.  Wallace Cullens, PharmD, [place] [phone]

## 2022-07-15 NOTE — Telephone Encounter (Signed)
Patient requesting renewal of lovastatin Rx as current Rx is out of refills. Would you please send new lovastatin Rx to [facility] for patient?  Thank you!  Stacey Townsend

## 2022-07-15 NOTE — Telephone Encounter (Signed)
Duplicate request- filled today Requested Prescriptions  Pending Prescriptions Disp Refills   lovastatin (MEVACOR) 10 MG tablet [Pharmacy Med Name: LOVASTATIN 10 MG TAB[*]] 30 tablet 2    Sig: TAKE ONE TABLET BY MOUTH AT BEDTIME     Cardiovascular:  Antilipid - Statins 2 Failed - 07/15/2022  3:41 PM      Failed - Cr in normal range and within 360 days    Creat  Date Value Ref Range Status  06/02/2022 1.19 (H) 0.50 - 0.99 mg/dL Final         Failed - Lipid Panel in normal range within the last 12 months    Cholesterol  Date Value Ref Range Status  06/02/2022 202 (H) <200 mg/dL Final   LDL Cholesterol (Calc)  Date Value Ref Range Status  06/02/2022 123 (H) mg/dL (calc) Final    Comment:    Reference range: <100 . Desirable range <100 mg/dL for primary prevention;   <70 mg/dL for patients with CHD or diabetic patients  with > or = 2 CHD risk factors. Marland Kitchen LDL-C is now calculated using the Martin-Hopkins  calculation, which is a validated novel method providing  better accuracy than the Friedewald equation in the  estimation of LDL-C.  Cresenciano Genre et al. Annamaria Helling. 8099;833(82): 2061-2068  (http://education.[employer].com/faq/FAQ164)    HDL  Date Value Ref Range Status  06/02/2022 56 > OR = 50 mg/dL Final   Triglycerides  Date Value Ref Range Status  06/02/2022 119 <150 mg/dL Final         Passed - Patient is not pregnant      Passed - Valid encounter within last 12 months    Recent Outpatient Visits           Today Primary hypertension   [place], Grayland Ormond A, RPH-CPP   3 weeks ago Primary hypertension   [place], Grayland Ormond A, RPH-CPP   1 month ago Chronic midline low back pain with right-sided sciatica   [facility] [place], Coralie Keens, NP   2 months ago Primary hypertension   [place], Virl Diamond, RPH-CPP   2 months ago Primary hypertension   [place], Virl Diamond, RPH-CPP       Future Appointments             In 1 week Garnette Gunner, Coralie Keens, NP [facility], [facility]

## 2022-07-22 ENCOUNTER — Telehealth: Payer: BC Managed Care – PPO

## 2022-07-22 ENCOUNTER — Ambulatory Visit: Payer: BC Managed Care – PPO | Admitting: Internal Medicine

## 2022-07-22 ENCOUNTER — Encounter: Payer: Self-pay | Admitting: Internal Medicine

## 2022-07-22 VITALS — BP 142/77 | HR 82 | Temp 96.9°F | Wt 284.0 lb

## 2022-07-22 DIAGNOSIS — E66813 Obesity, class 3: Secondary | ICD-10-CM

## 2022-07-22 DIAGNOSIS — Z6841 Body Mass Index (BMI) 40.0 and over, adult: Secondary | ICD-10-CM | POA: Diagnosis not present

## 2022-07-22 DIAGNOSIS — I10 Essential (primary) hypertension: Secondary | ICD-10-CM

## 2022-07-22 MED ORDER — LISINOPRIL-HYDROCHLOROTHIAZIDE 20-25 MG PO TABS: 1.0000 | ORAL_TABLET | Freq: Every day | ORAL | 0 refills | Status: DC

## 2022-07-22 NOTE — Assessment & Plan Note (Signed)
Improved but not at goal She admits that she missed her dose today We will have her return in 1 week for nurse visit BP check Continue 1 tab lisinopril HCT Reinforced DASH diet and exercise for weight loss CMET today

## 2022-07-22 NOTE — Patient Instructions (Signed)
Blood Pressure Record Sheet To take your blood pressure, you will need a blood pressure machine. You may be prescribed one, or you can buy a blood pressure machine (blood pressure monitor) at your clinic, drug store, or online. When choosing one, look for these features: An automatic monitor that has an arm cuff. A cuff that wraps snugly, but not too tightly, around your upper arm. You should be able to fit only one finger between your arm and the cuff. A device that stores blood pressure reading results. Do not choose a monitor that measures your blood pressure from your wrist or finger. Follow your health care provider's instructions for how to take your blood pressure. To use this form: Get one reading in the morning (a.m.) before you take any medicines. Get one reading in the evening (p.m.) before supper. Take at least two readings with each blood pressure check. This makes sure the results are correct. Wait 1-2 minutes between measurements. Write down the results in the spaces on this form. Repeat this once a week, or as told by your health care provider. Make a follow-up appointment with your health care provider to discuss the results. Blood pressure log Date: _______________________ a.m. _____________________(1st reading) _____________________(2nd reading) p.m. _____________________(1st reading) _____________________(2nd reading) Date: _______________________ a.m. _____________________(1st reading) _____________________(2nd reading) p.m. _____________________(1st reading) _____________________(2nd reading) Date: _______________________ a.m. _____________________(1st reading) _____________________(2nd reading) p.m. _____________________(1st reading) _____________________(2nd reading) Date: _______________________ a.m. _____________________(1st reading) _____________________(2nd reading) p.m. _____________________(1st reading) _____________________(2nd reading) Date:  _______________________ a.m. _____________________(1st reading) _____________________(2nd reading) p.m. _____________________(1st reading) _____________________(2nd reading) This information is not intended to replace advice given to you by your health care provider. Make sure you discuss any questions you have with your health care provider. Document Revised: 05/13/2021 Document Reviewed: 05/13/2021 Elsevier Patient Education  2023 Elsevier Inc.  

## 2022-07-22 NOTE — Progress Notes (Signed)
Subjective:    Patient ID: Stacey Townsend, female    DOB: 11-Oct-1975, 46 y.o.   MRN: [id number]  HPI  Patient presents to clinic today for follow-up of her HTN.  She is taking Lisinopril HCTZ as prescribed but admits that she missed her dose today.  Her BP today is 142/77. There is no ECG on file.  Review of Systems     Past Medical History:  Diagnosis Date   Asthma    Family history of clotting disorder    History of DVT (deep vein thrombosis)    Hypertension    Osteoarthritis, knee    Supraventricular tachycardia, paroxysmal ([place]) 2017    Current Outpatient Medications  Medication Sig Dispense Refill   acetaminophen (TYLENOL) 500 MG tablet Take 500 mg by mouth every 8 (eight) hours as needed.     albuterol (VENTOLIN HFA) 108 (90 Base) MCG/ACT inhaler Inhale 2 puffs into the lungs every 4 (four) hours as needed for wheezing or shortness of breath. 1 each 5   apixaban (ELIQUIS) 2.5 MG TABS tablet Take 1 tablet (2.5 mg total) by mouth 2 (two) times daily. 60 tablet 5   baclofen (LIORESAL) 10 MG tablet Take 0.5-1 tablets (5-10 mg total) by mouth 3 (three) times daily as needed for muscle spasms. 30 each 1   cetirizine (ZYRTEC) 10 MG tablet Take 10 mg by mouth daily as needed.     ciclesonide (ALVESCO) 80 MCG/ACT inhaler Inhale 1 puff into the lungs 2 (two) times daily.     lisinopril-hydrochlorothiazide (ZESTORETIC) 20-25 MG tablet Take 1 tablet by mouth daily. 90 tablet 0   lovastatin (MEVACOR) 10 MG tablet Take 1 tablet (10 mg total) by mouth at bedtime. 30 tablet 2   No current facility-administered medications for this visit.    Allergies  Allergen Reactions   Penicillins Shortness Of Breath    Hives and vomitting    Family History  Problem Relation Age of Onset   Deep vein thrombosis Mother    Thyroid disease Mother    Hypertension Father    Diabetes Father    Deep vein thrombosis Sister    Thyroid disease Sister    Hypertension Sister    Healthy Sister     Breast cancer Paternal Grandmother    Deep vein thrombosis Maternal Uncle    Deep vein thrombosis Paternal Aunt    Breast cancer Paternal Aunt    Deep vein thrombosis Paternal Uncle     Social History   Socioeconomic History   Marital status: Single    Spouse name: Not on file   Number of children: Not on file   Years of education: Not on file   Highest education level: Not on file  Occupational History   Not on file  Tobacco Use   Smoking status: Never   Smokeless tobacco: Never  Vaping Use   Vaping Use: Former   Devices: only 2-3 months  Substance and Sexual Activity   Alcohol use: Yes    Alcohol/week: 7.0 standard drinks of alcohol    Types: 7 Standard drinks or equivalent per week   Drug use: Never   Sexual activity: Yes  Other Topics Concern   Not on file  Social History Narrative   Not on file   Social Determinants of Health   Financial Resource Strain: Not on file  Food Insecurity: Not on file  Transportation Needs: Not on file  Physical Activity: Not on file  Stress: Not on file  Social  Connections: Not on file  Intimate Partner Violence: Not on file     Constitutional: Denies fever, malaise, fatigue, headache or abrupt weight changes.  Respiratory: Denies difficulty breathing, shortness of breath, cough or sputum production.   Cardiovascular: Denies chest pain, chest tightness, palpitations or swelling in the hands or feet.  Neurological: Denies dizziness, difficulty with memory, difficulty with speech or problems with balance and coordination.    No other specific complaints in a complete review of systems (except as listed in HPI above).  Objective:   Physical Exam  BP (!) 142/77 (BP Location: Right Arm, Patient Position: Sitting, Cuff Size: Large)   Pulse 82   Temp (!) 96.9 F (36.1 C) (Temporal)   Wt 284 lb (128.8 kg)   SpO2 100%   BMI 45.84 kg/m   Wt Readings from Last 3 Encounters:  06/02/22 296 lb (134.3 kg)  04/20/22 289 lb  (131.1 kg)  02/14/22 285 lb 6.4 oz (129.5 kg)    General: Appears her stated age, obese in NAD. Cardiovascular: Normal rate and rhythm. S1,S2 noted.  No murmur, rubs or gallops noted.  Pulmonary/Chest: Normal effort and positive vesicular breath sounds. No respiratory distress. No wheezes, rales or ronchi noted.  Neurological: Alert and oriented.   BMET    Component Value Date/Time   NA 137 06/02/2022 1554   K 4.0 06/02/2022 1554   CL 101 06/02/2022 1554   CO2 30 06/02/2022 1554   GLUCOSE 95 06/02/2022 1554   BUN 14 06/02/2022 1554   CREATININE 1.19 (H) 06/02/2022 1554   CALCIUM 9.1 06/02/2022 1554   GFRNONAA >60 02/04/2022 0940    Lipid Panel     Component Value Date/Time   CHOL 202 (H) 06/02/2022 1554   TRIG 119 06/02/2022 1554   HDL 56 06/02/2022 1554   CHOLHDL 3.6 06/02/2022 1554   LDLCALC 123 (H) 06/02/2022 1554    CBC    Component Value Date/Time   WBC 6.2 02/04/2022 0940   RBC 5.14 (H) 02/04/2022 0940   HGB 12.9 02/04/2022 0940   HCT 40.2 02/04/2022 0940   PLT 230 02/04/2022 0940   MCV 78.2 (L) 02/04/2022 0940   MCH 25.1 (L) 02/04/2022 0940   MCHC 32.1 02/04/2022 0940   RDW 14.7 02/04/2022 0940   LYMPHSABS 1.7 02/04/2022 0940   MONOABS 0.5 02/04/2022 0940   EOSABS 0.2 02/04/2022 0940   BASOSABS 0.1 02/04/2022 0940    Hgb A1C Lab Results  Component Value Date   HGBA1C 6.0 (H) 06/02/2022           Assessment & Plan:     RTC in 4 months for your annual exam Webb Silversmith, NP

## 2022-07-22 NOTE — Assessment & Plan Note (Signed)
Encourage diet and exercise weight loss 

## 2022-08-02 ENCOUNTER — Other Ambulatory Visit: Payer: Self-pay | Admitting: Family Medicine

## 2022-08-02 DIAGNOSIS — M5441 Lumbago with sciatica, right side: Secondary | ICD-10-CM

## 2022-08-03 NOTE — Telephone Encounter (Signed)
Requested Prescriptions  Pending Prescriptions Disp Refills   baclofen (LIORESAL) 10 MG tablet [Pharmacy Med Name: BACLOFEN 10 MG TAB] 30 tablet 1    Sig: TAKE ONE-HALF TO ONE TABLET BY MOUTH THREE TIMES DAILY AS NEEDED FOR MUSCLE SPASM     Analgesics:  Muscle Relaxants - baclofen Failed - 08/02/2022  7:05 PM      Failed - Cr in normal range and within 180 days    Creat  Date Value Ref Range Status  06/02/2022 1.19 (H) 0.50 - 0.99 mg/dL Final         Passed - eGFR is 30 or above and within 180 days    GFR, Estimated  Date Value Ref Range Status  02/04/2022 >60 >60 mL/min Final    Comment:    (NOTE) Calculated using the CKD-EPI Creatinine Equation (2021)    eGFR  Date Value Ref Range Status  06/02/2022 57 (L) > OR = 60 mL/min/1.56m Final         Passed - Valid encounter within last 6 months    Recent Outpatient Visits           1 week ago Primary hypertension   [place]   2 weeks ago Primary hypertension   [place] RPH-CPP   1 month ago Primary hypertension   [place] OrmondA, RPH-CPP   2 months ago Chronic midline low back pain with right-sided sciatica   [facility]   2 months ago Primary hypertension   [place] RPH-CPP       Future Appointments             [facility]

## 2022-08-09 ENCOUNTER — Ambulatory Visit (INDEPENDENT_AMBULATORY_CARE_PROVIDER_SITE_OTHER): Payer: BC Managed Care – PPO

## 2022-08-09 VITALS — BP 135/75

## 2022-08-09 DIAGNOSIS — I10 Essential (primary) hypertension: Secondary | ICD-10-CM

## 2022-08-09 NOTE — Progress Notes (Unsigned)
Hypertension, follow-up  BP Readings from Last 3 Encounters:  07/22/22 (!) 142/77  06/02/22 (!) 146/70  04/20/22 (!) 154/83   Wt Readings from Last 3 Encounters:  07/22/22 284 lb (128.8 kg)  06/02/22 296 lb (134.3 kg)  04/20/22 289 lb (131.1 kg)     She was last seen for hypertension on 07/22/2022. BP at that visit was 142/77. Management since that visit includes increasing lisinopril/hctz to 20/'25mg'$ .  Take one full tablet daily.  She reports excellent compliance with treatment. She is not having side effects.  ---------------------------------------------------------------------------------------------------    Today's BP  Left arm: 135/75    Pt also advised of lab results.  She is going to work on lifestyle changes for increasing her Lovastatin.

## 2022-08-15 ENCOUNTER — Inpatient Hospital Stay (HOSPITAL_BASED_OUTPATIENT_CLINIC_OR_DEPARTMENT_OTHER): Payer: BC Managed Care – PPO | Admitting: Oncology

## 2022-08-15 ENCOUNTER — Encounter: Payer: Self-pay | Admitting: Oncology

## 2022-08-15 ENCOUNTER — Inpatient Hospital Stay: Payer: BC Managed Care – PPO | Attending: Oncology

## 2022-08-15 VITALS — BP 134/75 | HR 86 | Temp 98.0°F | Resp 18 | Wt 282.0 lb

## 2022-08-15 DIAGNOSIS — E876 Hypokalemia: Secondary | ICD-10-CM | POA: Insufficient documentation

## 2022-08-15 DIAGNOSIS — Z7901 Long term (current) use of anticoagulants: Secondary | ICD-10-CM | POA: Diagnosis not present

## 2022-08-15 DIAGNOSIS — Z86718 Personal history of other venous thrombosis and embolism: Secondary | ICD-10-CM | POA: Insufficient documentation

## 2022-08-15 DIAGNOSIS — D6862 Lupus anticoagulant syndrome: Secondary | ICD-10-CM | POA: Diagnosis present

## 2022-08-15 LAB — CBC WITH DIFFERENTIAL/PLATELET
Abs Immature Granulocytes: 0.01 10*3/uL (ref 0.00–0.07)
Basophils Absolute: 0.1 10*3/uL (ref 0.0–0.1)
Basophils Relative: 1 %
Eosinophils Absolute: 0.3 10*3/uL (ref 0.0–0.5)
Eosinophils Relative: 4 %
HCT: 37.7 % (ref 36.0–46.0)
Hemoglobin: 12.2 g/dL (ref 12.0–15.0)
Immature Granulocytes: 0 %
Lymphocytes Relative: 30 %
Lymphs Abs: 2.2 10*3/uL (ref 0.7–4.0)
MCH: 25.7 pg — ABNORMAL LOW (ref 26.0–34.0)
MCHC: 32.4 g/dL (ref 30.0–36.0)
MCV: 79.4 fL — ABNORMAL LOW (ref 80.0–100.0)
Monocytes Absolute: 0.4 10*3/uL (ref 0.1–1.0)
Monocytes Relative: 6 %
Neutro Abs: 4.4 10*3/uL (ref 1.7–7.7)
Neutrophils Relative %: 59 %
Platelets: 228 10*3/uL (ref 150–400)
RBC: 4.75 MIL/uL (ref 3.87–5.11)
RDW: 15.7 % — ABNORMAL HIGH (ref 11.5–15.5)
WBC: 7.4 10*3/uL (ref 4.0–10.5)
nRBC: 0 % (ref 0.0–0.2)

## 2022-08-15 LAB — COMPREHENSIVE METABOLIC PANEL
ALT: 21 U/L (ref 0–44)
AST: 17 U/L (ref 15–41)
Albumin: 4.2 g/dL (ref 3.5–5.0)
Alkaline Phosphatase: 52 U/L (ref 38–126)
Anion gap: 10 (ref 5–15)
BUN: 21 mg/dL — ABNORMAL HIGH (ref 6–20)
CO2: 30 mmol/L (ref 22–32)
Calcium: 8.9 mg/dL (ref 8.9–10.3)
Chloride: 99 mmol/L (ref 98–111)
Creatinine, Ser: 0.89 mg/dL (ref 0.44–1.00)
GFR, Estimated: >60 mL/min (ref >60)
Glucose, Bld: 98 mg/dL (ref 70–99)
Potassium: 3.3 mmol/L — ABNORMAL LOW (ref 3.5–5.1)
Sodium: 139 mmol/L (ref 135–145)
Total Bilirubin: 0.4 mg/dL (ref 0.3–1.2)
Total Protein: 7.2 g/dL (ref 6.5–8.1)

## 2022-08-15 MED ORDER — APIXABAN 2.5 MG PO TABS: 2.5000 mg | ORAL_TABLET | Freq: Two times a day (BID) | ORAL | 5 refills | Status: DC

## 2022-08-15 NOTE — Assessment & Plan Note (Signed)
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 Negative anticardiolipin IgG/IgM.  10/07/2021 positive lupus anticoagulant 02/04/2022, repeat testing showed negative lupus anticoagulant.  Not meeting APS diagnosis criteria June 2023- present Eliquis 2.'5mg'$  BID for long-term anticoagulation prophylaxis.  Continue current regimen.

## 2022-08-15 NOTE — Assessment & Plan Note (Signed)
Recommend patient to take potassium rich food

## 2022-08-15 NOTE — Progress Notes (Signed)
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 Negative anticardiolipin IgG/IgM.  10/07/2021 positive lupus anticoagulant 02/04/2022, repeat testing showed negative lupus anticoagulant.  Not meeting APS diagnosis criteria June 2023- present Eliquis 2.'5mg'$  BID for long-term anticoagulation prophylaxis.  Continue current regimen.   Hypokalemia Recommend patient to take potassium rich food   Orders Placed This Encounter  Procedures   CBC with Differential/Platelet    Standing Status:   Future    Standing Expiration Date:   08/16/2023   Comprehensive metabolic panel    Standing Status:   Future    Standing Expiration Date:   08/15/2023   Follow up in 6 months.  All questions were answered. The patient knows to call the clinic with any problems, questions or concerns.  Earlie Server, MD, PhD [facility] Hematology Oncology 08/15/2022   CHIEF COMPLAINTS/REASON FOR VISIT:   history of DVT  HISTORY OF PRESENTING ILLNESS:   Stacey Townsend is a  46 y.o.  female with PMH listed below was seen in consultation at the request of  Jearld Fenton, NP  for evaluation of history of DVT  12/02/2020, patient presented to urgent care for evaluation of left lower extremity pain.  Was diagnosed with isolated calf DVT involving one of the posterior tibial and peroneal veins.  Patient was started on Lovenox bridged Pradaxa 150 mg twice daily.  Patient tolerates anticoagulation.  There was no immobilization factors prior to her symptom presentation. She had COVID 19 infection in January 2022.  Patient moved to  [place] from [place] in July 2022.  She has establish care with primary care provider and was referred to establish care with hematology for management of history of DVT and anticoagulation.  Patient reports significant family history of blood clots, mother, sister, uncle. Patient denies smoking. Family history positive for breast cancer, paternal aunt and maternal grandmother.  INTERVAL HISTORY Stacey Townsend is a 46 y.o. female who has above history reviewed by me today presents for follow up visit for management of history of DVT Patient is currently taking Eliquis 2.'5mg'$  BID. Leg pain has improved.  No active bleeding event.  No new complaints.   Review of Systems  Constitutional:  Negative for appetite change, chills, fatigue and fever.  HENT:   Negative for hearing loss and voice change.   Eyes:  Negative for eye problems.  Respiratory:  Negative for chest tightness and cough.   Cardiovascular:  Negative for chest pain.  Gastrointestinal:  Negative for abdominal distention, abdominal pain and blood in stool.  Endocrine: Negative for hot flashes.  Genitourinary:  Negative for difficulty urinating and frequency.   Musculoskeletal:  Negative for arthralgias.  Skin:  Negative for itching and rash.  Neurological:  Negative for extremity weakness.  Hematological:  Negative for adenopathy.  Psychiatric/Behavioral:  Negative for confusion.     MEDICAL HISTORY:  Past Medical History:  Diagnosis Date   Asthma    Family history of clotting disorder    History of DVT (deep vein thrombosis)    Hypertension    Osteoarthritis, knee    Supraventricular tachycardia, paroxysmal 2017    SURGICAL HISTORY:  Past Surgical History:  Procedure Laterality Date   COLONOSCOPY WITH PROPOFOL N/A 11/04/2021   Procedure: COLONOSCOPY WITH PROPOFOL;  Surgeon: Jonathon Bellows, MD;  Location[facility] ENDOSCOPY;  Service: Gastroenterology;  Laterality: N/A;   DILATION AND CURETTAGE OF UTERUS  2013    endometerial ablation  2016   HYSTEROSCOPY  2013    SOCIAL HISTORY: Social History   Socioeconomic History   Marital status: Single    Spouse name: Not on file   Number of children: Not on file   Years of education: Not on file   Highest education level: Not on file  Occupational History   Not on file  Tobacco Use   Smoking status: Never   Smokeless tobacco: Never  Vaping Use   Vaping Use: Former   Devices: only 2-3 months  Substance and Sexual Activity   Alcohol use: Yes    Alcohol/week: 7.0 standard drinks of alcohol    Types: 7 Standard drinks or equivalent per week   Drug use: Never   Sexual activity: Yes  Other Topics Concern   Not on file  Social History Narrative   Not on file   Social Determinants of Health   Financial Resource Strain: Not on file  Food Insecurity: Not on file  Transportation Needs: Not on file  Physical Activity: Not on file  Stress: Not on file  Social Connections: Not on file  Intimate Partner Violence: Not on file    FAMILY HISTORY: Family History  Problem Relation Age of Onset   Deep vein thrombosis Mother    Thyroid disease Mother    Hypertension Father    Diabetes Father    Deep vein thrombosis Sister    Thyroid disease Sister    Hypertension Sister    Healthy Sister    Breast cancer Paternal Grandmother    Deep vein thrombosis Maternal Uncle    Deep vein thrombosis Paternal Aunt    Breast cancer Paternal Aunt    Deep vein thrombosis Paternal Uncle     ALLERGIES:  is allergic to penicillins.  MEDICATIONS:  Current Outpatient Medications  Medication Sig Dispense Refill   albuterol (VENTOLIN HFA) 108 (90 Base) MCG/ACT inhaler Inhale 2 puffs into the lungs every 4 (four) hours as needed for wheezing or shortness of breath. 1 each 5   baclofen (LIORESAL) 10 MG tablet TAKE ONE-HALF TO ONE TABLET BY MOUTH THREE TIMES DAILY AS NEEDED FOR MUSCLE SPASM 30 tablet 1   cetirizine (ZYRTEC) 10 MG tablet Take 10 mg by mouth daily  as needed.     ciclesonide (ALVESCO) 80 MCG/ACT inhaler Inhale 1 puff into the lungs 2 (two) times daily.     lisinopril-hydrochlorothiazide (ZESTORETIC) 20-25 MG tablet Take 1 tablet by mouth daily. 90 tablet 0   lovastatin (MEVACOR) 10 MG tablet Take 1 tablet (10 mg total) by mouth at bedtime. 30 tablet 2   acetaminophen (TYLENOL) 500 MG tablet Take 500 mg by mouth every 8 (eight) hours as needed. (Patient not taking: Reported on 08/15/2022)     apixaban (ELIQUIS) 2.5 MG TABS tablet Take 1 tablet (2.5 mg total) by mouth 2 (two) times daily. 60 tablet 5   No current facility-administered medications for this visit.     PHYSICAL EXAMINATION: ECOG PERFORMANCE STATUS: 0 - Asymptomatic Vitals:   08/15/22 1327  BP: 134/75  Pulse: 86  Resp: 18  Temp: 98 F (36.7 C)   Filed Weights   08/15/22 1327  Weight: 282 lb (127.9 kg)  Physical Exam Constitutional:      General: She is not in acute distress. HENT:     Head: Normocephalic and atraumatic.  Eyes:     General: No scleral icterus. Cardiovascular:     Rate and Rhythm: Normal rate and regular rhythm.     Heart sounds: Normal heart sounds.  Pulmonary:     Effort: Pulmonary effort is normal. No respiratory distress.     Breath sounds: No wheezing.  Abdominal:     General: Bowel sounds are normal. There is no distension.     Palpations: Abdomen is soft.  Musculoskeletal:        General: No deformity. Normal range of motion.     Cervical back: Normal range of motion and neck supple.  Skin:    General: Skin is warm and dry.     Findings: No erythema or rash.  Neurological:     Mental Status: She is alert and oriented to person, place, and time. Mental status is at baseline.     Cranial Nerves: No cranial nerve deficit.     Coordination: Coordination normal.  Psychiatric:        Mood and Affect: Mood normal.     LABORATORY DATA:  I have reviewed the data as listed    Latest Ref Rng & Units 08/15/2022    1:11 PM  02/04/2022    9:40 AM 10/07/2021   10:46 AM  CBC  WBC 4.0 - 10.5 K/uL 7.4  6.2  4.9   Hemoglobin 12.0 - 15.0 g/dL 12.2  12.9  12.9   Hematocrit 36.0 - 46.0 % 37.7  40.2  40.3   Platelets 150 - 400 K/uL 228  230  233       Latest Ref Rng & Units 08/15/2022    1:11 PM 06/02/2022    3:54 PM 02/04/2022    9:40 AM  CMP  Glucose 70 - 99 mg/dL 98  95  98   BUN 6 - 20 mg/dL '21  14  13   '$ Creatinine 0.44 - 1.00 mg/dL 0.89  1.19  0.88   Sodium 135 - 145 mmol/L 139  137  135   Potassium 3.5 - 5.1 mmol/L 3.3  4.0  3.8   Chloride 98 - 111 mmol/L 99  101  101   CO2 22 - 32 mmol/L '30  30  28   '$ Calcium 8.9 - 10.3 mg/dL 8.9  9.1  8.8   Total Protein 6.5 - 8.1 g/dL 7.2  6.7  7.4   Total Bilirubin 0.3 - 1.2 mg/dL 0.4  0.2  0.5   Alkaline Phos 38 - 126 U/L 52   59   AST 15 - 41 U/L '17  15  19   '$ ALT 0 - 44 U/L '21  18  20    '$ Iron/TIBC/Ferritin/ %Sat No results found for: "IRON", "TIBC", "FERRITIN", "IRONPCTSAT"    RADIOGRAPHIC STUDIES: I have personally reviewed the radiological images as listed and agreed with the findings in the report. No results found.

## 2022-08-17 NOTE — Progress Notes (Signed)
BP improved. Continue current treatment.

## 2022-08-26 ENCOUNTER — Telehealth: Payer: BC Managed Care – PPO

## 2022-08-26 ENCOUNTER — Telehealth: Payer: Self-pay | Admitting: Pharmacist

## 2022-08-26 NOTE — Telephone Encounter (Signed)
   Outreach Note  08/26/2022 Name: SOL ODOR MRN: [id number] DOB: [birth year]  Referred by: Jearld Fenton, NP Reason for referral : Hypertension   From review of chart, note patient seen by PCP on 11/10 for Hypertension Follow-up. BP 142/77, HR 82 at visit, but patient reported she had missed taking her lisinopril-HCTZ that morning.  - Note patient presented for Follow up BP check on 11/28, reading: 135/75  Was unable to reach patient via telephone today and have left HIPAA compliant voicemail asking patient to return my call.   Follow Up Plan: Will outreach to patient by telephone again within the next 30 days  Wallace Cullens, PharmD, [facility] [phone]

## 2022-09-15 ENCOUNTER — Ambulatory Visit: Payer: Self-pay

## 2022-09-15 NOTE — Telephone Encounter (Signed)
  Chief Complaint: hemorrhoids Symptoms: hemorrhoids, rectal pain and bleeding  Frequency: since Thanksgiving  Pertinent Negatives: NA Disposition: '[]'$ ED /'[]'$ Urgent Care (no appt availability in office) / '[x]'$ Appointment(In office/virtual)/ '[]'$  [facility]/ '[]'$ Home Care/ '[]'$ Refused Recommended Disposition /'[]'$  Mobile Bus/ '[]'$  Follow-up with PCP Additional Notes: pt has issues with hemorrhoids previously but had flare up since Thanksgiving currently. Has blood when wiping or mixed in with stool. States been using cream but not helping. Scheduled appt for 09/16/22 at 1020 with PCP.   Summary: hemorrhoids   Pt called in requesting a referral. Pt says that she has discussed having hemorrhoids with her PCP in the past. Pt says since the holidays she has been having a flare up and has bright blood on her tissue when she wipe. Pt would like further assistance.         Reason for Disposition . MILD rectal bleeding (more than just a few drops or streaks)  Answer Assessment - Initial Assessment Questions 1. APPEARANCE of BLOOD: "What color is it?" "Is it passed separately, on the surface of the stool, or mixed in with the stool?"      Small amounts with stool and then wiping  2. AMOUNT: "How much blood was passed?"     4. ONSET: "When was the blood first seen in the stools?" (Days or weeks)      Thanksgiving  5. DIARRHEA: "Is there also some diarrhea?" If Yes, ask: "How many diarrhea stools in the past 24 hours?"      Lately more diarrhea  6. CONSTIPATION: "Do you have constipation?" If Yes, ask: "How bad is it?"     Yes at times  7. RECURRENT SYMPTOMS: "Have you had blood in your stools before?" If Yes, ask: "When was the last time?" and "What happened that time?"      Had issues previously  8. BLOOD THINNERS: "Do you take any blood thinners?" (e.g., Coumadin/warfarin, Pradaxa/dabigatran, aspirin)     Eliquis  9. OTHER SYMPTOMS: "Do you have any other symptoms?"  (e.g., abdomen  pain, vomiting, dizziness, fever)  Rectal pain  Protocols used: Rectal Bleeding-A-AH

## 2022-09-16 ENCOUNTER — Ambulatory Visit: Payer: BC Managed Care – PPO | Admitting: Internal Medicine

## 2022-09-16 ENCOUNTER — Encounter: Payer: Self-pay | Admitting: Internal Medicine

## 2022-09-16 VITALS — BP 136/82 | HR 83 | Temp 96.9°F | Wt 281.0 lb

## 2022-09-16 DIAGNOSIS — K5900 Constipation, unspecified: Secondary | ICD-10-CM | POA: Diagnosis not present

## 2022-09-16 DIAGNOSIS — K644 Residual hemorrhoidal skin tags: Secondary | ICD-10-CM | POA: Diagnosis not present

## 2022-09-16 MED ORDER — HYDROCORT-PRAMOXINE (PERIANAL) 1-1 % EX FOAM: 1.0000 | Freq: Two times a day (BID) | CUTANEOUS | 0 refills | Status: DC

## 2022-09-16 NOTE — Patient Instructions (Signed)
Hemorrhoids Hemorrhoids are swollen veins that may develop: In the butt (rectum). These are called internal hemorrhoids. Around the opening of the butt (anus). These are called external hemorrhoids. Hemorrhoids can cause pain, itching, or bleeding. Most of the time, they do not cause serious problems. They usually get better with diet changes, lifestyle changes, and other home treatments. What are the causes? This condition may be caused by: Having trouble pooping (constipation). Pushing hard (straining) to poop. Watery poop (diarrhea). Pregnancy. Being very overweight (obese). Sitting for long periods of time. Heavy lifting or other activity that causes you to strain. Anal sex. Riding a bike for a long period of time. What are the signs or symptoms? Symptoms of this condition include: Pain. Itching or soreness in the butt. Bleeding from the butt. Leaking poop. Swelling in the area. One or more lumps around the opening of your butt. How is this diagnosed? A doctor can often diagnose this condition by looking at the affected area. The doctor may also: Do an exam that involves feeling the area with a gloved hand (digital rectal exam). Examine the area inside your butt using a small tube (anoscope). Order blood tests. This may be done if you have lost a lot of blood. Have you get a test that involves looking inside the colon using a flexible tube with a camera on the end (sigmoidoscopy or colonoscopy). How is this treated? This condition can usually be treated at home. Your doctor may tell you to change what you eat, make lifestyle changes, or try home treatments. If these do not help, procedures can be done to remove the hemorrhoids or make them smaller. These may involve: Placing rubber bands at the base of the hemorrhoids to cut off their blood supply. Injecting medicine into the hemorrhoids to shrink them. Shining a type of light energy onto the hemorrhoids to cause them to fall  off. Doing surgery to remove the hemorrhoids or cut off their blood supply. Follow these instructions at home: Eating and drinking  Eat foods that have a lot of fiber in them. These include whole grains, beans, nuts, fruits, and vegetables. Ask your doctor about taking products that have added fiber (fibersupplements). Reduce the amount of fat in your diet. You can do this by: Eating low-fat dairy products. Eating less red meat. Avoiding processed foods. Drink enough fluid to keep your pee (urine) pale yellow. Managing pain and swelling  Take a warm-water bath (sitz bath) for 20 minutes to ease pain. Do this 3-4 times a day. You may do this in a bathtub or using a portable sitz bath that fits over the toilet. If told, put ice on the painful area. It may be helpful to use ice between your warm baths. Put ice in a plastic bag. Place a towel between your skin and the bag. Leave the ice on for 20 minutes, 2-3 times a day. General instructions Take over-the-counter and prescription medicines only as told by your doctor. Medicated creams and medicines may be used as told. Exercise often. Ask your doctor how much and what kind of exercise is best for you. Go to the bathroom when you have the urge to poop. Do not wait. Avoid pushing too hard when you poop. Keep your butt dry and clean. Use wet toilet paper or moist towelettes after pooping. Do not sit on the toilet for a long time. Keep all follow-up visits as told by your doctor. This is important. Contact a doctor if you: Have pain and   swelling that do not get better with treatment or medicine. Have trouble pooping. Cannot poop. Have pain or swelling outside the area of the hemorrhoids. Get help right away if you have: Bleeding that will not stop. Summary Hemorrhoids are swollen veins in the butt or around the opening of the butt. They can cause pain, itching, or bleeding. Eat foods that have a lot of fiber in them. These include  whole grains, beans, nuts, fruits, and vegetables. Take a warm-water bath (sitz bath) for 20 minutes to ease pain. Do this 3-4 times a day. This information is not intended to replace advice given to you by your health care provider. Make sure you discuss any questions you have with your health care provider. Document Revised: 03/09/2021 Document Reviewed: 03/10/2021 Elsevier Patient Education  2023 Elsevier Inc.  

## 2022-09-16 NOTE — Progress Notes (Signed)
Subjective:    Patient ID: Stacey Townsend, female    DOB: 17-Sep-1975, 47 y.o.   MRN: [id number]  HPI  Patient presents to clinic today with complaint of rectal pain and bleeding.  This started 2 months ago.  She does have a history of hemorrhoids and constipation.  She has tried Preparation H OTC with minimal relief of symptoms.  Colonoscopy from 10/2021 reviewed.  Review of Systems  Past Medical History:  Diagnosis Date   Asthma    Family history of clotting disorder    History of DVT (deep vein thrombosis)    Hypertension    Osteoarthritis, knee    Supraventricular tachycardia, paroxysmal 2017    Current Outpatient Medications  Medication Sig Dispense Refill   acetaminophen (TYLENOL) 500 MG tablet Take 500 mg by mouth every 8 (eight) hours as needed. (Patient not taking: Reported on 08/15/2022)     albuterol (VENTOLIN HFA) 108 (90 Base) MCG/ACT inhaler Inhale 2 puffs into the lungs every 4 (four) hours as needed for wheezing or shortness of breath. 1 each 5   apixaban (ELIQUIS) 2.5 MG TABS tablet Take 1 tablet (2.5 mg total) by mouth 2 (two) times daily. 60 tablet 5   baclofen (LIORESAL) 10 MG tablet TAKE ONE-HALF TO ONE TABLET BY MOUTH THREE TIMES DAILY AS NEEDED FOR MUSCLE SPASM 30 tablet 1   cetirizine (ZYRTEC) 10 MG tablet Take 10 mg by mouth daily as needed.     ciclesonide (ALVESCO) 80 MCG/ACT inhaler Inhale 1 puff into the lungs 2 (two) times daily.     lisinopril-hydrochlorothiazide (ZESTORETIC) 20-25 MG tablet Take 1 tablet by mouth daily. 90 tablet 0   lovastatin (MEVACOR) 10 MG tablet Take 1 tablet (10 mg total) by mouth at bedtime. 30 tablet 2   No current facility-administered medications for this visit.    Allergies  Allergen Reactions   Penicillins Shortness Of Breath    Hives and vomitting    Family History  Problem Relation Age of Onset   Deep vein thrombosis Mother    Thyroid disease Mother    Hypertension Father    Diabetes Father    Deep vein  thrombosis Sister    Thyroid disease Sister    Hypertension Sister    Healthy Sister    Breast cancer Paternal Grandmother    Deep vein thrombosis Maternal Uncle    Deep vein thrombosis Paternal Aunt    Breast cancer Paternal Aunt    Deep vein thrombosis Paternal Uncle     Social History   Socioeconomic History   Marital status: Single    Spouse name: Not on file   Number of children: Not on file   Years of education: Not on file   Highest education level: Not on file  Occupational History   Not on file  Tobacco Use   Smoking status: Never   Smokeless tobacco: Never  Vaping Use   Vaping Use: Former   Devices: only 2-3 months  Substance and Sexual Activity   Alcohol use: Yes    Alcohol/week: 7.0 standard drinks of alcohol    Types: 7 Standard drinks or equivalent per week   Drug use: Never   Sexual activity: Yes  Other Topics Concern   Not on file  Social History Narrative   Not on file   Social Determinants of Health   Financial Resource Strain: Not on file  Food Insecurity: Not on file  Transportation Needs: Not on file  Physical Activity: Not on file  Stress: Not on file  Social Connections: Not on file  Intimate Partner Violence: Not on file     Constitutional: Denies fever, malaise, fatigue, headache or abrupt weight changes.  Respiratory: Denies difficulty breathing, shortness of breath, cough or sputum production.   Cardiovascular: Denies chest pain, chest tightness, palpitations or swelling in the hands or feet.  Gastrointestinal: Patient reports intermittent constipation rectal pain and bleeding.  Denies abdominal pain, bloating, diarrhea.   No other specific complaints in a complete review of systems (except as listed in HPI above).     Objective:   Physical Exam  BP 136/82 (BP Location: Left Arm, Patient Position: Sitting, Cuff Size: Large)   Pulse 83   Temp (!) 96.9 F (36.1 C) (Temporal)   Wt 281 lb (127.5 kg)   SpO2 100%   BMI 45.35  kg/m   Wt Readings from Last 3 Encounters:  08/15/22 282 lb (127.9 kg)  07/22/22 284 lb (128.8 kg)  06/02/22 296 lb (134.3 kg)    General: Appears her stated age, obese, in NAD. Cardiovascular: Normal rate and rhythm.  Pulmonary/Chest: Normal effort and positive vesicular breath sounds.  Abdomen: Normal bowel sounds.  Rectal: External hemorrhoid noted at 12:00. Neurological: Alert and oriented.   BMET    Component Value Date/Time   NA 139 08/15/2022 1311   K 3.3 (L) 08/15/2022 1311   CL 99 08/15/2022 1311   CO2 30 08/15/2022 1311   GLUCOSE 98 08/15/2022 1311   BUN 21 (H) 08/15/2022 1311   CREATININE 0.89 08/15/2022 1311   CREATININE 1.19 (H) 06/02/2022 1554   CALCIUM 8.9 08/15/2022 1311   GFRNONAA >60 08/15/2022 1311    Lipid Panel     Component Value Date/Time   CHOL 202 (H) 06/02/2022 1554   TRIG 119 06/02/2022 1554   HDL 56 06/02/2022 1554   CHOLHDL 3.6 06/02/2022 1554   LDLCALC 123 (H) 06/02/2022 1554    CBC    Component Value Date/Time   WBC 7.4 08/15/2022 1311   RBC 4.75 08/15/2022 1311   HGB 12.2 08/15/2022 1311   HCT 37.7 08/15/2022 1311   PLT 228 08/15/2022 1311   MCV 79.4 (L) 08/15/2022 1311   MCH 25.7 (L) 08/15/2022 1311   MCHC 32.4 08/15/2022 1311   RDW 15.7 (H) 08/15/2022 1311   LYMPHSABS 2.2 08/15/2022 1311   MONOABS 0.4 08/15/2022 1311   EOSABS 0.3 08/15/2022 1311   BASOSABS 0.1 08/15/2022 1311    Hgb A1C Lab Results  Component Value Date   HGBA1C 6.0 (H) 06/02/2022           Assessment & Plan:   External Hemorrhoid:  Rx for Proctofoam twice daily x 6 days Encourage high-fiber diet and adequate water intake Okay to use stool softeners as needed for constipation Referral to GI for further evaluation  RTC in 2 months for annual exam Webb Silversmith, NP

## 2022-09-19 ENCOUNTER — Telehealth: Payer: BC Managed Care – PPO

## 2022-09-26 ENCOUNTER — Ambulatory Visit: Payer: BC Managed Care – PPO | Admitting: Pharmacist

## 2022-09-26 DIAGNOSIS — I10 Essential (primary) hypertension: Secondary | ICD-10-CM

## 2022-09-26 NOTE — Progress Notes (Cosign Needed Addendum)
  Chronic Care Management   Outreach Note  09/26/2022 Name: Stacey Townsend MRN: [id number] DOB: May 15, 1976  I connected with Stacey Townsend on 09/26/22 by telephone outreach and verified that I am speaking with the correct person using two identifiers.  Patient previously appearing on report for [place] Metric Hypertension Control due to documented ambulatory blood pressure of 146/70 on 06/02/2022. Next appointment with PCP is scheduled for 11/17/2022  From review of chart, note when patient last seen for office visit with PCP on 09/16/2022, BP reading: 136/82, HR 82.   Outreached patient to discuss hypertension control and medication management.   Outpatient Encounter Medications as of 09/26/2022  Medication Sig   lisinopril-hydrochlorothiazide (ZESTORETIC) 20-25 MG tablet Take 1 tablet by mouth daily.   lovastatin (MEVACOR) 10 MG tablet Take 1 tablet (10 mg total) by mouth at bedtime.   acetaminophen (TYLENOL) 500 MG tablet Take 500 mg by mouth every 8 (eight) hours as needed.   albuterol (VENTOLIN HFA) 108 (90 Base) MCG/ACT inhaler Inhale 2 puffs into the lungs every 4 (four) hours as needed for wheezing or shortness of breath.   apixaban (ELIQUIS) 2.5 MG TABS tablet Take 1 tablet (2.5 mg total) by mouth 2 (two) times daily.   baclofen (LIORESAL) 10 MG tablet TAKE ONE-HALF TO ONE TABLET BY MOUTH THREE TIMES DAILY AS NEEDED FOR MUSCLE SPASM   cetirizine (ZYRTEC) 10 MG tablet Take 10 mg by mouth daily as needed.   ciclesonide (ALVESCO) 80 MCG/ACT inhaler Inhale 1 puff into the lungs 2 (two) times daily.   hydrocortisone-pramoxine (PROCTOFOAM-HC) rectal foam Place 1 applicator rectally 2 (two) times daily.   No facility-administered encounter medications on file as of 09/26/2022.    Lab Results  Component Value Date   CREATININE 0.89 08/15/2022   BUN 21 (H) 08/15/2022   NA 139 08/15/2022   K 3.3 (L) 08/15/2022   CL 99 08/15/2022   CO2 30 08/15/2022    BP Readings from  Last 3 Encounters:  09/16/22 136/82  08/15/22 134/75  08/09/22 135/75    Pulse Readings from Last 3 Encounters:  09/16/22 83  08/15/22 86  07/22/22 82    Current medications: lisinopril-hydrochlorothiazide 20-25 mg - 1 tablet daily Denies missed doses; patient using twice daily phone alarm as adherence aid   Home Monitoring: Patient does not have an automated upper arm home BP machine Again encourage patient to obtain an upper arm home blood pressure monitor. Patient denies monitoring home blood pressure currently   Reports has reduced her salt intake Encourage patient to continue to review nutrition labels and limit salt/sodium intake   Current physical activity: reports stays active throughout the day at work x 5 days/week. Reports  walking/resting/stretching for ~2 hours x 2-3 days   Assessment/Plan: - Reviewed goal blood pressure <130/80 - Counseled on long term microvascular and macrovascular complications of uncontrolled hypertension - Counsel on importance of medication adherence. Patient to follow up with pharmacy for medication refills prior to running out of medications - Recommend to obtain home upper arm blood pressure monitor, and start to check blood pressure, document, and provide at next provider visit - Discussed dietary modifications, such as reduced salt intake, focus on whole grains, vegetables, lean proteins   Follow Up Plan: CM Pharmacist will outreach to patient by telephone again on 12/19/2022 at 1 pm   Wallace Cullens, PharmD, [place] [phone]

## 2022-09-26 NOTE — Patient Instructions (Signed)
Check your blood pressure twice weekly, and any time you have concerning symptoms like headache, chest pain, dizziness, shortness of breath, or vision changes.   Our goal is less than 130/80.  To appropriately check your blood pressure, make sure you do the following:  1) Avoid caffeine, exercise, or tobacco products for 30 minutes before checking. Empty your bladder. 2) Sit with your back supported in a flat-backed chair. Rest your arm on something flat (arm of the chair, table, etc). 3) Sit still with your feet flat on the floor, resting, for at least 5 minutes.  4) Check your blood pressure. Take 1-2 readings.  5) Write down these readings and bring with you to any provider appointments.  Bring your home blood pressure machine with you to a provider's office for accuracy comparison at least once a year.   Make sure you take your blood pressure medications before you come to any office visit, even if you were asked to fast for labs.  Wallace Cullens, PharmD, [place] [phone]

## 2022-10-13 ENCOUNTER — Encounter: Payer: Self-pay | Admitting: Gastroenterology

## 2022-10-13 ENCOUNTER — Ambulatory Visit: Payer: BC Managed Care – PPO | Admitting: Gastroenterology

## 2022-10-13 VITALS — BP 154/89 | HR 76 | Temp 98.3°F | Ht 68.0 in | Wt 281.2 lb

## 2022-10-13 DIAGNOSIS — R194 Change in bowel habit: Secondary | ICD-10-CM

## 2022-10-13 NOTE — Progress Notes (Signed)
Jonathon Bellows MD, MRCP(U.K) [address]  [place]  [place], [place] [address]  Main: [phone]  Fax: [phone]   Gastroenterology Consultation  Referring Provider:     Jearld Fenton, NP Primary Care Physician:  Jearld Fenton, NP Primary Gastroenterologist:  Dr. Jonathon Bellows  Reason for Consultation:     Hemorrhoids         HPI:   Stacey Townsend is a 47 y.o. y/o female referred for consultation & management  by Jearld Fenton, NP.     She is a colonoscopy in February 2023 by myself and I resected a polyp in the descending colon via EMR repeat colonoscopy was recommended in 3 years reviewing of the pictures she had some internal hemorrhoids not very large.  She is on Eliquis.  She states that since the new year she has been having softer bowel movements compared to the past.  At some point had a bit of blood felt it was due to hemorrhoid which has stopped.  Denies any use of artificial sugars weakness.  Denies any recent use of NSAIDs.  Denies any use of chewing gum.  This occurs 3 times a week.  Denies any watery stools.  Denies any other GI symptoms.  Past Medical History:  Diagnosis Date   Asthma    Family history of clotting disorder    History of DVT (deep vein thrombosis)    Hypertension    Osteoarthritis, knee    Supraventricular tachycardia, paroxysmal 2017    Past Surgical History:  Procedure Laterality Date   COLONOSCOPY WITH PROPOFOL N/A 11/04/2021   Procedure: COLONOSCOPY WITH PROPOFOL;  Surgeon: Jonathon Bellows, MD;  Location: [facility] ENDOSCOPY;  Service: Gastroenterology;  Laterality: N/A;   DILATION AND CURETTAGE OF UTERUS  2013   endometerial ablation  2016   HYSTEROSCOPY  2013    Prior to Admission medications   Medication Sig Start Date End Date Taking? Authorizing Provider  acetaminophen (TYLENOL) 500 MG tablet Take 500 mg by mouth every 8 (eight) hours as needed.    [provider]  albuterol (VENTOLIN HFA) 108 (90 Base) MCG/ACT  inhaler Inhale 2 puffs into the lungs every 4 (four) hours as needed for wheezing or shortness of breath. 02/15/22 02/15/23  Jearld Fenton, NP  apixaban (ELIQUIS) 2.5 MG TABS tablet Take 1 tablet (2.5 mg total) by mouth 2 (two) times daily. 08/15/22   Earlie Server, MD  baclofen (LIORESAL) 10 MG tablet TAKE ONE-HALF TO ONE TABLET BY MOUTH THREE TIMES DAILY AS NEEDED FOR MUSCLE SPASM 08/03/22   [place] Ranger, Devonne Doughty, DO  cetirizine (ZYRTEC) 10 MG tablet Take 10 mg by mouth daily as needed.    [provider]  ciclesonide (ALVESCO) 80 MCG/ACT inhaler Inhale 1 puff into the lungs 2 (two) times daily.    [provider]  hydrocortisone-pramoxine [facility]) rectal foam Place 1 applicator rectally 2 (two) times daily. 09/16/22   Jearld Fenton, NP  lisinopril-hydrochlorothiazide (ZESTORETIC) 20-25 MG tablet Take 1 tablet by mouth daily. 07/22/22 09/01/23  Jearld Fenton, NP  lovastatin (MEVACOR) 10 MG tablet Take 1 tablet (10 mg total) by mouth at bedtime. 07/15/22   Jearld Fenton, NP    Family History  Problem Relation Age of Onset   Deep vein thrombosis Mother    Thyroid disease Mother    Hypertension Father    Diabetes Father    Deep vein thrombosis Sister    Thyroid disease Sister  Hypertension Sister    Healthy Sister    Breast cancer Paternal Grandmother    Deep vein thrombosis Maternal Uncle    Deep vein thrombosis Paternal Aunt    Breast cancer Paternal Aunt    Deep vein thrombosis Paternal Uncle      Social History   Tobacco Use   Smoking status: Never   Smokeless tobacco: Never  Vaping Use   Vaping Use: Former   Devices: only 2-3 months  Substance Use Topics   Alcohol use: Yes    Alcohol/week: 7.0 standard drinks of alcohol    Types: 7 Standard drinks or equivalent per week   Drug use: Never    Allergies as of 10/13/2022 - Review Complete 10/13/2022  Allergen Reaction Noted   Penicillins Shortness Of Breath 11/05/2013    Review of Systems:     All systems reviewed and negative except where noted in HPI.   Physical Exam:  BP (!) 154/89   Pulse 76   Temp 98.3 F (36.8 C) (Oral)   Ht '5\' 8"'$  (1.727 m)   Wt 281 lb 3.2 oz (127.6 kg)   BMI 42.76 kg/m  No LMP recorded. Psych:  Alert and cooperative. Normal mood and affect. General:   Alert,  Well-developed, well-nourished, pleasant and cooperative in NAD Head:  Normocephalic and atraumatic. Eyes:  Sclera clear, no icterus.   Conjunctiva pink. Rectal exam chaperone in the room: Skin tags in the perianal area no other abnormality seen on digital examination. Neurologic:  Alert and oriented x3;  grossly normal neurologically. Psych:  Alert and cooperative. Normal mood and affect.  Imaging Studies: No results found.  Assessment and Plan:   Stacey Townsend is a 47 y.o. y/o female here to see me for hemorrhoids.  She also has some change in consistency of her stool which is softer than usual.  She is on Eliquis and has had a colonoscopy last year which was normal except for polyp  Plan 1.  Suggest a high-fiber diet as needed use of Imodium if issue with abnormal stool persists to contact our office in a few weeks.  Not clearly diarrhea at this point of time just softer stool 2.  She has an external skin tag which explained to her is more of a cosmetic issue and if she really needs to have it taken out should undergo surgery.  She does not have any significant hemorrhoids that are prolapsing out nor any symptoms from them at this point of time.  If she does have any symptoms from hemorrhoids she can come back to see [place] for the same  Follow up in as needed  Dr Jonathon Bellows MD,MRCP(U.K)

## 2022-10-13 NOTE — Patient Instructions (Signed)
Nonsurgical Procedures for Hemorrhoids, Care After This sheet gives you information about how to care for yourself after your procedure. Your health care provider may also give you more specific instructions. If you have problems or questions, contact your health care provider. What can I expect after the procedure? After the procedure, it is common to have: Slight rectal bleeding for a few days. Soreness or a dull ache in the rectal area. Follow these instructions at home: Medicines Take over-the-counter and prescription medicines only as told by your health care provider. Use a stool softener or a bulk laxative as told by your health care provider. Activity  Return to your normal activities as told by your health care provider. Ask your health care provider what activities are safe for you. Do not lift anything that is heavier than 10 lb (4.5 kg), or the limit that you are told, until your health care provider says that it is safe. Do not sit for long periods of time without moving. Take a walk every day or as told by your health care provider. Managing pain and swelling Take warm sitz baths for 20 minutes, 3-4 times a day to ease pain and discomfort. You may do this in a bathtub or using a portable sitz bath that fits over the toilet. If directed, apply ice to the affected area. Using ice packs between sitz baths may be helpful. Put ice in a plastic bag. Place a towel between your skin and the bag. Leave the ice on for 20 minutes, 2-3 times a day. Eating and drinking  Eat foods that have a lot of fiber in them, such as whole grains, beans, nuts, fruits, and vegetables. Drink enough fluid to keep your urine pale yellow. General instructions Do not strain to have a bowel movement. Do not spend a long time sitting on the toilet. Keep all follow-up visits as told by your health care provider. This is important. Contact a health care provider if: Your pain medicine is not helping. You  have a fever. You become constipated. You continue to have light rectal bleeding for more than a few days. You are unable to pass urine (urinary retention). Get help right away if you have: Very bad rectal pain. Heavy bleeding from your rectum. Summary After the procedure, it is common to have slight rectal bleeding and soreness in the area. Taking warm sitz baths and applying ice may be helpful to relieve the discomfort. Eat foods that have a lot of fiber in them, such as whole grains, beans, nuts, fruits, and vegetables. Get help right away if you have excessive pain or heavy bleeding from your rectum. This information is not intended to replace advice given to you by your health care provider. Make sure you discuss any questions you have with your health care provider. Document Revised: 03/10/2021 Document Reviewed: 03/10/2021 Elsevier Patient Education  [place].

## 2022-11-14 ENCOUNTER — Other Ambulatory Visit: Payer: Self-pay | Admitting: Internal Medicine

## 2022-11-14 ENCOUNTER — Ambulatory Visit: Payer: Self-pay | Admitting: *Deleted

## 2022-11-14 MED ORDER — LISINOPRIL-HYDROCHLOROTHIAZIDE 20-25 MG PO TABS: 1.0000 | ORAL_TABLET | Freq: Every day | ORAL | 0 refills | Status: DC

## 2022-11-14 NOTE — Telephone Encounter (Signed)
  Chief Complaint: patient calling to request updated dosing- Rx sent as no print  Disposition: '[]'$ ED /'[]'$ Urgent Care (no appt availability in office) / '[]'$ Appointment(In office/virtual)/ '[]'$  [facility]/ '[x]'$ Home Care/ '[]'$ Refused Recommended Disposition /'[]'$ [facility] [facility]/ '[]'$  Follow-up with PCP Additional Notes: Change in Rx dosing sent as no print- resent Rx for patient today  Reason for Disposition . [1] Caller requesting a prescription renewal (no refills left), no triage required, AND [2] triager able to renew prescription per department policy  Answer Assessment - Initial Assessment Questions 1. DRUG NAME: "What medicine do you need to have refilled?"     Lisinopril- HCTZ - requested at wrong dosing- patient has Rx for 1 pill instead of half pill 2. REFILLS REMAINING: "How many refills are remaining?" (Note: The label on the medicine or pill bottle will show how many refills are remaining. If there are no refills remaining, then a renewal may be needed.)     none 3. EXPIRATION DATE: "What is the expiration date?" (Note: The label states when the prescription will expire, and thus can no longer be refilled.)     *No Answer* 4. PRESCRIBING HCP: "Who prescribed it?" Reason: If prescribed by specialist, call should be referred to that group.     PCP  Protocols used: Medication Refill and Renewal Call-A-AH

## 2022-11-14 NOTE — Telephone Encounter (Signed)
Wrong dosing ordered- will resubmit Requested Prescriptions  Pending Prescriptions Disp Refills   lisinopril-hydrochlorothiazide (ZESTORETIC) 20-25 MG tablet [Pharmacy Med Name: LISINOPRIL-HCTZ 20-25 MG TAB[*]] 45 tablet 1    Sig: TAKE ONE-HALF TABLET BY MOUTH ONE TIME DAILY     Cardiovascular:  ACEI + Diuretic Combos Failed - 11/14/2022  4:00 PM      Failed - K in normal range and within 180 days    Potassium  Date Value Ref Range Status  08/15/2022 3.3 (L) 3.5 - 5.1 mmol/L Final         Failed - Last BP in normal range    BP Readings from Last 1 Encounters:  10/13/22 (!) 154/89         Passed - Na in normal range and within 180 days    Sodium  Date Value Ref Range Status  08/15/2022 139 135 - 145 mmol/L Final         Passed - Cr in normal range and within 180 days    Creat  Date Value Ref Range Status  06/02/2022 1.19 (H) 0.50 - 0.99 mg/dL Final   Creatinine, Ser  Date Value Ref Range Status  08/15/2022 0.89 0.44 - 1.00 mg/dL Final         Passed - eGFR is 30 or above and within 180 days    GFR, Estimated  Date Value Ref Range Status  08/15/2022 >60 >60 mL/min Final    Comment:    (NOTE) Calculated using the CKD-EPI Creatinine Equation (2021)    eGFR  Date Value Ref Range Status  06/02/2022 57 (L) > OR = 60 mL/min/1.34m Final         Passed - Patient is not pregnant      Passed - Valid encounter within last 6 months    Recent Outpatient Visits           1 month ago [place] hypertension   [place] [facility]   1 month ago [facility]   3 [place] [facility] [place]  3 [place] Primary [place]   [place] [facility] [place]   4 months ago Primary hypertension   [place] [facility]        Future Appointments             In 1 week Baity[facility]

## 2022-11-14 NOTE — Telephone Encounter (Signed)
Summary: med ?   Patient called in states was told by Dr Garnette Gunner to increase dosage of Lisinopril-hydroCHLOROthiazide from 1/2 pill to a whle one, but pharmacy still has 1/2 pill on fie.         Rx was sent 07/22/22- marked as no print- patient states she is out of her medication.

## 2022-11-17 ENCOUNTER — Ambulatory Visit: Payer: BC Managed Care – PPO | Admitting: Internal Medicine

## 2022-11-23 ENCOUNTER — Ambulatory Visit: Payer: BC Managed Care – PPO | Admitting: Internal Medicine

## 2022-11-23 ENCOUNTER — Encounter: Payer: Self-pay | Admitting: Internal Medicine

## 2022-11-23 VITALS — BP 132/74 | HR 99 | Temp 96.9°F | Ht 68.0 in | Wt 279.0 lb

## 2022-11-23 DIAGNOSIS — Z0001 Encounter for general adult medical examination with abnormal findings: Secondary | ICD-10-CM | POA: Diagnosis not present

## 2022-11-23 DIAGNOSIS — R7303 Prediabetes: Secondary | ICD-10-CM

## 2022-11-23 DIAGNOSIS — E7800 Pure hypercholesterolemia, unspecified: Secondary | ICD-10-CM | POA: Diagnosis not present

## 2022-11-23 DIAGNOSIS — Z6841 Body Mass Index (BMI) 40.0 and over, adult: Secondary | ICD-10-CM

## 2022-11-23 DIAGNOSIS — Z1231 Encounter for screening mammogram for malignant neoplasm of breast: Secondary | ICD-10-CM | POA: Diagnosis not present

## 2022-11-23 MED ORDER — APIXABAN 2.5 MG PO TABS: 2.5000 mg | ORAL_TABLET | Freq: Two times a day (BID) | ORAL | 0 refills | Status: DC

## 2022-11-23 MED ORDER — ALBUTEROL SULFATE HFA 108 (90 BASE) MCG/ACT IN AERS: 2.0000 | INHALATION_SPRAY | RESPIRATORY_TRACT | 5 refills | Status: DC | PRN

## 2022-11-23 NOTE — Patient Instructions (Signed)

## 2022-11-23 NOTE — Progress Notes (Signed)
Subjective:    Patient ID: Stacey Townsend, female    DOB: December 21, 1975, 47 y.o.   MRN: [id number]  HPI  Patient presents to clinic today for her annual exam.  Flu: 07/2013 Tetanus: 12/2020 COVID: never Pap smear: 07/2018 Mammogram: > 2 years ago Colon screening: 10/2021 Vision screening: annually Dentist: as needed  Diet: She does eat meat. She consumes fruits and veggies. She does eat some fried foods. She drinks mostly water, juice, soda Exercise: Walking  Review of Systems     Past Medical History:  Diagnosis Date   Asthma    Family history of clotting disorder    History of DVT (deep vein thrombosis)    Hypertension    Osteoarthritis, knee    Supraventricular tachycardia, paroxysmal 2017    Current Outpatient Medications  Medication Sig Dispense Refill   acetaminophen (TYLENOL) 500 MG tablet Take 500 mg by mouth every 8 (eight) hours as needed.     albuterol (VENTOLIN HFA) 108 (90 Base) MCG/ACT inhaler Inhale 2 puffs into the lungs every 4 (four) hours as needed for wheezing or shortness of breath. 1 each 5   apixaban (ELIQUIS) 2.5 MG TABS tablet Take 1 tablet (2.5 mg total) by mouth 2 (two) times daily. 60 tablet 5   baclofen (LIORESAL) 10 MG tablet TAKE ONE-HALF TO ONE TABLET BY MOUTH THREE TIMES DAILY AS NEEDED FOR MUSCLE SPASM 30 tablet 1   cetirizine (ZYRTEC) 10 MG tablet Take 10 mg by mouth daily as needed.     ciclesonide (ALVESCO) 80 MCG/ACT inhaler Inhale 1 puff into the lungs 2 (two) times daily.     hydrocortisone-pramoxine (PROCTOFOAM-HC) rectal foam Place 1 applicator rectally 2 (two) times daily. (Patient not taking: Reported on 10/13/2022) 10 g 0   lisinopril-hydrochlorothiazide (ZESTORETIC) 20-25 MG tablet Take 1 tablet by mouth daily. 90 tablet 0   lovastatin (MEVACOR) 10 MG tablet Take 1 tablet (10 mg total) by mouth at bedtime. 30 tablet 2   No current facility-administered medications for this visit.    Allergies  Allergen Reactions    Penicillins Shortness Of Breath    Hives and vomitting    Family History  Problem Relation Age of Onset   Deep vein thrombosis Mother    Thyroid disease Mother    Hypertension Father    Diabetes Father    Deep vein thrombosis Sister    Thyroid disease Sister    Hypertension Sister    Healthy Sister    Breast cancer Paternal Grandmother    Deep vein thrombosis Maternal Uncle    Deep vein thrombosis Paternal Aunt    Breast cancer Paternal Aunt    Deep vein thrombosis Paternal Uncle     Social History   Socioeconomic History   Marital status: Single    Spouse name: Not on file   Number of children: Not on file   Years of education: Not on file   Highest education level: Not on file  Occupational History   Not on file  Tobacco Use   Smoking status: Never   Smokeless tobacco: Never  Vaping Use   Vaping Use: Former   Devices: only 2-3 months  Substance and Sexual Activity   Alcohol use: Yes    Alcohol/week: 7.0 standard drinks of alcohol    Types: 7 Standard drinks or equivalent per week   Drug use: Never   Sexual activity: Yes  Other Topics Concern   Not on file  Social History Narrative   Not on  file   Social Determinants of Health   Financial Resource Strain: Not on file  Food Insecurity: Not on file  Transportation Needs: Not on file  Physical Activity: Not on file  Stress: Not on file  Social Connections: Not on file  Intimate Partner Violence: Not on file     Constitutional: Denies fever, malaise, fatigue, headache or abrupt weight changes.  HEENT: Denies eye pain, eye redness, ear pain, ringing in the ears, wax buildup, runny nose, nasal congestion, bloody nose, or sore throat. Respiratory: Denies difficulty breathing, shortness of breath, cough or sputum production.   Cardiovascular: Denies chest pain, chest tightness, palpitations or swelling in the hands or feet.  Gastrointestinal: Denies abdominal pain, bloating, constipation, diarrhea or blood in  the stool.  GU: Denies urgency, frequency, pain with urination, burning sensation, blood in urine, odor or discharge. Musculoskeletal: Patient reports chronic joint pain.  Denies decrease in range of motion, difficulty with gait, muscle pain or joint swelling.  Skin: Denies redness, rashes, lesions or ulcercations.  Neurological: Denies dizziness, difficulty with memory, difficulty with speech or problems with balance and coordination.  Psych: Denies anxiety, depression, SI/HI.  No other specific complaints in a complete review of systems (except as listed in HPI above).  Objective:   Physical Exam  BP 132/74 (BP Location: Left Arm, Patient Position: Sitting, Cuff Size: Large)   Pulse 99   Temp (!) 96.9 F (36.1 C) (Temporal)   Ht '5\' 8"'$  (1.727 m)   Wt 279 lb (126.6 kg)   SpO2 99%   BMI 42.42 kg/m   Wt Readings from Last 3 Encounters:  10/13/22 281 lb 3.2 oz (127.6 kg)  09/16/22 281 lb (127.5 kg)  08/15/22 282 lb (127.9 kg)    General: Appears her stated age, obese, in NAD. Skin: Warm, dry and intact. HEENT: Head: normal shape and size; Eyes: sclera white, no icterus, conjunctiva pink, PERRLA and EOMs intact;  Neck:  Neck supple, trachea midline. No masses, lumps or thyromegaly present.  Cardiovascular: Normal rate and rhythm. S1,S2 noted.  No murmur, rubs or gallops noted. No JVD or BLE edema. Pulmonary/Chest: Normal effort and positive vesicular breath sounds. No respiratory distress. No wheezes, rales or ronchi noted.  Abdomen: Soft and nontender. Normal bowel sounds.  Musculoskeletal: Strength 5/5 BUE/BLE. No difficulty with gait.  Neurological: Alert and oriented. Cranial nerves II-XII grossly intact. Coordination normal.  Psychiatric: Mood and affect normal. Behavior is normal. Judgment and thought content normal.     BMET    Component Value Date/Time   NA 139 08/15/2022 1311   K 3.3 (L) 08/15/2022 1311   CL 99 08/15/2022 1311   CO2 30 08/15/2022 1311   GLUCOSE 98  08/15/2022 1311   BUN 21 (H) 08/15/2022 1311   CREATININE 0.89 08/15/2022 1311   CREATININE 1.19 (H) 06/02/2022 1554   CALCIUM 8.9 08/15/2022 1311   GFRNONAA >60 08/15/2022 1311    Lipid Panel     Component Value Date/Time   CHOL 202 (H) 06/02/2022 1554   TRIG 119 06/02/2022 1554   HDL 56 06/02/2022 1554   CHOLHDL 3.6 06/02/2022 1554   LDLCALC 123 (H) 06/02/2022 1554    CBC    Component Value Date/Time   WBC 7.4 08/15/2022 1311   RBC 4.75 08/15/2022 1311   HGB 12.2 08/15/2022 1311   HCT 37.7 08/15/2022 1311   PLT 228 08/15/2022 1311   MCV 79.4 (L) 08/15/2022 1311   MCH 25.7 (L) 08/15/2022 1311   MCHC 32.4  08/15/2022 1311   RDW 15.7 (H) 08/15/2022 1311   LYMPHSABS 2.2 08/15/2022 1311   MONOABS 0.4 08/15/2022 1311   EOSABS 0.3 08/15/2022 1311   BASOSABS 0.1 08/15/2022 1311    Hgb A1C Lab Results  Component Value Date   HGBA1C 6.0 (H) 06/02/2022          Assessment & Plan:   Preventative Health Maintenance:  Encouraged her to get a flu shot in fall Tetanus UTD Encouraged her to get her COVID booster Pap smear declined today, she will do at her next annual Mammogram ordered-she will call to schedule Colon screening UTD Encouraged her to consume a balanced diet and exercise regimen Advised her seeing eye doctor and dentist annually We will check CBC, c-Met, lipid, A1c today  RTC in 6 months, follow-up chronic conditions Webb Silversmith, NP

## 2022-11-23 NOTE — Assessment & Plan Note (Signed)
Encourage diet and exercise for weight loss 

## 2022-11-24 LAB — CBC
HCT: 40 % (ref 35.0–45.0)
Hemoglobin: 13.1 g/dL (ref 11.7–15.5)
MCH: 25.4 pg — ABNORMAL LOW (ref 27.0–33.0)
MCHC: 32.8 g/dL (ref 32.0–36.0)
MCV: 77.7 fL — ABNORMAL LOW (ref 80.0–100.0)
MPV: 12.1 fL (ref 7.5–12.5)
Platelets: 239 Thousand/uL (ref 140–400)
RBC: 5.15 Million/uL — ABNORMAL HIGH (ref 3.80–5.10)
RDW: 14.4 % (ref 11.0–15.0)
WBC: 6.8 Thousand/uL (ref 3.8–10.8)

## 2022-11-24 LAB — COMPLETE METABOLIC PANEL WITHOUT GFR
AG Ratio: 1.7 (calc) (ref 1.0–2.5)
ALT: 17 U/L (ref 6–29)
AST: 14 U/L (ref 10–35)
Albumin: 4.3 g/dL (ref 3.6–5.1)
Alkaline phosphatase (APISO): 61 U/L (ref 31–125)
BUN/Creatinine Ratio: 15 (calc) (ref 6–22)
BUN: 15 mg/dL (ref 7–25)
CO2: 28 mmol/L (ref 20–32)
Calcium: 9.2 mg/dL (ref 8.6–10.2)
Chloride: 104 mmol/L (ref 98–110)
Creat: 1.02 mg/dL — ABNORMAL HIGH (ref 0.50–0.99)
Globulin: 2.5 g/dL (ref 1.9–3.7)
Glucose, Bld: 104 mg/dL (ref 65–139)
Potassium: 4 mmol/L (ref 3.5–5.3)
Sodium: 140 mmol/L (ref 135–146)
Total Bilirubin: 0.3 mg/dL (ref 0.2–1.2)
Total Protein: 6.8 g/dL (ref 6.1–8.1)
eGFR: 68 mL/min/1.73m2 (ref >=60)

## 2022-11-24 LAB — HEMOGLOBIN A1C
Hgb A1c MFr Bld: 6.1 %{Hb} — ABNORMAL HIGH (ref <5.7)
Mean Plasma Glucose: 128 mg/dL
eAG (mmol/L): 7.1 mmol/L

## 2022-11-24 LAB — LIPID PANEL
Cholesterol: 222 mg/dL — ABNORMAL HIGH (ref <200)
HDL: 58 mg/dL (ref >=50)
LDL Cholesterol (Calc): 132 mg/dL — ABNORMAL HIGH
Non-HDL Cholesterol (Calc): 164 mg/dL — ABNORMAL HIGH (ref <130)
Total CHOL/HDL Ratio: 3.8 (calc) (ref <5.0)
Triglycerides: 188 mg/dL — ABNORMAL HIGH (ref <150)

## 2022-12-19 ENCOUNTER — Telehealth: Payer: Self-pay | Admitting: Pharmacist

## 2022-12-19 ENCOUNTER — Telehealth: Payer: BC Managed Care – PPO

## 2022-12-19 NOTE — Telephone Encounter (Addendum)
   Outreach Note  12/19/2022 Name: Stacey Townsend MRN: [id number] DOB: 1976-07-15  Referred by: Lorre Munroe, NP Reason for referral : No chief complaint on file.   Was unable to reach patient via telephone today and have left HIPAA compliant voicemail asking patient to return my call.    Follow Up Plan: Will attempt to reach patient by telephone again within the next 14 days  Estelle Grumbles, PharmD, [facility] Clinical Pharmacist [facility] [phone]

## 2022-12-21 ENCOUNTER — Telehealth: Payer: BC Managed Care – PPO

## 2023-02-03 ENCOUNTER — Other Ambulatory Visit: Payer: Self-pay | Admitting: Internal Medicine

## 2023-02-03 DIAGNOSIS — E782 Mixed hyperlipidemia: Secondary | ICD-10-CM

## 2023-02-03 NOTE — Telephone Encounter (Signed)
Labs in date  Requested Prescriptions  Pending Prescriptions Disp Refills   lovastatin (MEVACOR) 10 MG tablet [Pharmacy Med Name: LOVASTATIN 10 MG TAB[*]] 30 tablet 2    Sig: TAKE ONE TABLET BY MOUTH AT BEDTIME     Cardiovascular:  Antilipid - Statins 2 Failed - 02/03/2023  2:48 PM      Failed - Cr in normal range and within 360 days    Creat  Date Value Ref Range Status  11/23/2022 1.02 (H) 0.50 - 0.99 mg/dL Final         Failed - Lipid Panel in normal range within the last 12 months    Cholesterol  Date Value Ref Range Status  11/23/2022 222 (H) <200 mg/dL Final   LDL Cholesterol (Calc)  Date Value Ref Range Status  11/23/2022 132 (H) mg/dL (calc) Final    Comment:    Reference range: <100 . Desirable range <100 mg/dL for primary prevention;   <70 mg/dL for patients with CHD or diabetic patients  with > or = 2 CHD risk factors. Marland Kitchen LDL-C is now calculated using the Martin-Hopkins  calculation, which is a validated novel method providing  better accuracy than the Friedewald equation in the  estimation of LDL-C.  Horald Pollen et al. Lenox Ahr. 1610;960(45): 2061-2068  (http://education.[employer].com/faq/FAQ164)    HDL  Date Value Ref Range Status  11/23/2022 58 > OR = 50 mg/dL Final   Triglycerides  Date Value Ref Range Status  11/23/2022 188 (H) <150 mg/dL Final         Passed - Patient is not pregnant      Passed - Valid encounter within last 12 months    Recent Outpatient Visits           2 months ago Encounter for general adult medical examination with abnormal findings   [facility] [place], Salvadore Oxford, NP   4 months ago Primary hypertension   [place] [facility] [place], Jackelyn Poling, RPH-CPP   4 months ago External hemorrhoid   [facility] [place], Salvadore Oxford, NP   5 months ago Primary hypertension   [place] Dopp, [place]   6 months ago Primary  hypertension   [facility] [place], Salvadore Oxford, NP       Future Appointments             In 4 months Baity, Salvadore Oxford, NP [facility]

## 2023-02-17 ENCOUNTER — Inpatient Hospital Stay: Payer: BC Managed Care – PPO | Admitting: Oncology

## 2023-02-17 ENCOUNTER — Inpatient Hospital Stay: Payer: BC Managed Care – PPO | Attending: Oncology

## 2023-02-17 ENCOUNTER — Encounter: Payer: Self-pay | Admitting: Oncology

## 2023-02-17 VITALS — BP 140/76 | HR 67 | Temp 98.1°F | Resp 18 | Wt 284.0 lb

## 2023-02-17 DIAGNOSIS — Z86718 Personal history of other venous thrombosis and embolism: Secondary | ICD-10-CM | POA: Insufficient documentation

## 2023-02-17 DIAGNOSIS — Z803 Family history of malignant neoplasm of breast: Secondary | ICD-10-CM

## 2023-02-17 DIAGNOSIS — M79604 Pain in right leg: Secondary | ICD-10-CM | POA: Diagnosis not present

## 2023-02-17 DIAGNOSIS — Z7901 Long term (current) use of anticoagulants: Secondary | ICD-10-CM | POA: Diagnosis not present

## 2023-02-17 DIAGNOSIS — Z809 Family history of malignant neoplasm, unspecified: Secondary | ICD-10-CM | POA: Insufficient documentation

## 2023-02-17 DIAGNOSIS — M79605 Pain in left leg: Secondary | ICD-10-CM

## 2023-02-17 LAB — CBC WITH DIFFERENTIAL/PLATELET
Abs Immature Granulocytes: 0.02 10*3/uL (ref 0.00–0.07)
Basophils Absolute: 0.1 10*3/uL (ref 0.0–0.1)
Basophils Relative: 1 %
Eosinophils Absolute: 0.1 10*3/uL (ref 0.0–0.5)
Eosinophils Relative: 2 %
HCT: 39 % (ref 36.0–46.0)
Hemoglobin: 12.4 g/dL (ref 12.0–15.0)
Immature Granulocytes: 0 %
Lymphocytes Relative: 29 %
Lymphs Abs: 2.1 10*3/uL (ref 0.7–4.0)
MCH: 25.1 pg — ABNORMAL LOW (ref 26.0–34.0)
MCHC: 31.8 g/dL (ref 30.0–36.0)
MCV: 78.8 fL — ABNORMAL LOW (ref 80.0–100.0)
Monocytes Absolute: 0.5 10*3/uL (ref 0.1–1.0)
Monocytes Relative: 7 %
Neutro Abs: 4.4 10*3/uL (ref 1.7–7.7)
Neutrophils Relative %: 61 %
Platelets: 217 10*3/uL (ref 150–400)
RBC: 4.95 MIL/uL (ref 3.87–5.11)
RDW: 15.3 % (ref 11.5–15.5)
WBC: 7.2 10*3/uL (ref 4.0–10.5)
nRBC: 0 % (ref 0.0–0.2)

## 2023-02-17 LAB — COMPREHENSIVE METABOLIC PANEL
ALT: 21 U/L (ref 0–44)
AST: 18 U/L (ref 15–41)
Albumin: 4 g/dL (ref 3.5–5.0)
Alkaline Phosphatase: 52 U/L (ref 38–126)
Anion gap: 7 (ref 5–15)
BUN: 13 mg/dL (ref 6–20)
CO2: 28 mmol/L (ref 22–32)
Calcium: 9 mg/dL (ref 8.9–10.3)
Chloride: 104 mmol/L (ref 98–111)
Creatinine, Ser: 0.82 mg/dL (ref 0.44–1.00)
GFR, Estimated: >60 mL/min (ref >60)
Glucose, Bld: 92 mg/dL (ref 70–99)
Potassium: 3.7 mmol/L (ref 3.5–5.1)
Sodium: 139 mmol/L (ref 135–145)
Total Bilirubin: 0.4 mg/dL (ref 0.3–1.2)
Total Protein: 7.1 g/dL (ref 6.5–8.1)

## 2023-02-17 MED ORDER — APIXABAN 2.5 MG PO TABS: 2.5000 mg | ORAL_TABLET | Freq: Two times a day (BID) | ORAL | 1 refills | Status: DC

## 2023-02-17 NOTE — Assessment & Plan Note (Addendum)
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 Negative anticardiolipin IgG/IgM.  10/07/2021 positive lupus anticoagulant 02/04/2022, repeat testing showed negative lupus anticoagulant.  Not meeting APS diagnosis criteria June 2023-present time she is on Eliquis 2.5mg  BID for long-term anticoagulation prophylaxis.  Continue current regimen.  Check [place] lower extremities for further work up of left LE swelling and right LE pain.

## 2023-02-17 NOTE — Progress Notes (Signed)
Patient here for follow up. Reports that she has been having pain to legs

## 2023-02-17 NOTE — Progress Notes (Signed)
Hematology/Oncology Progress note [phone] [phone]         Patient Care Team: Lorre Munroe, NP as PCP - General (Internal Medicine) Rickard Patience, MD as Consulting Physician (Hematology and Oncology) Manuela Neptune, RPH-CPP as Pharmacist  ASSESSMENT & PLAN:   History of DVT (deep vein thrombosis) #Unprovoked DVT March 2022- June 2023 -Pradaxa  Hypercoagulable work up negative: negative Factor 5 leiden mutation, negative prothrombin gene mutation. Negative anticardiolipin IgG/IgM.  10/07/2021 positive lupus anticoagulant 02/04/2022, repeat testing showed negative lupus anticoagulant.  Not meeting APS diagnosis criteria June 2023-present time she is on Eliquis 2.5mg  BID for long-term anticoagulation prophylaxis.  Continue current regimen.  Check [place] lower extremities for further work up of left LE swelling and right LE pain.   Family history of cancer Refer to genetic counselor.    Orders Placed This Encounter  Procedures   US Venous Img Lower Bilateral    Standing Status:   Future    Standing Expiration Date:   02/17/2024    Order Specific Question:   Reason for Exam (SYMPTOM  OR DIAGNOSIS REQUIRED)    Answer:   leg pain and edema.    Order Specific Question:   Preferred imaging location?    Answer:   [facility]   CBC with Differential (Cancer Center Only)    Standing Status:   Future    Standing Expiration Date:   02/17/2024   CMP (Cancer Center only)    Standing Status:   Future    Standing Expiration Date:   02/17/2024   Ambulatory referral to Genetics    Referral Priority:   Routine    Referral Type:   Consultation    Referral Reason:   Specialty Services Required    Number of Visits Requested:   1   Follow up in 6 months.  All questions were answered. The patient knows to call the clinic with any problems, questions or concerns.  Rickard Patience, MD, PhD [facility] Hematology Oncology 02/17/2023   CHIEF COMPLAINTS/REASON FOR VISIT:    history of DVT  HISTORY OF PRESENTING ILLNESS:   Stacey Townsend is a  47 y.o.  female with PMH listed below was seen in consultation at the request of  Lorre Munroe, NP  for evaluation of history of DVT  12/02/2020, patient presented to urgent care for evaluation of left lower extremity pain.  Was diagnosed with isolated calf DVT involving one of the posterior tibial and peroneal veins.  Patient was started on Lovenox bridged Pradaxa 150 mg twice daily.  Patient tolerates anticoagulation.  There was no immobilization factors prior to her symptom presentation. She had COVID 19 infection in January 2022.  Patient moved to [place] from [place] in July 2022.  She has establish care with primary care provider and was referred to establish care with hematology for management of history of DVT and anticoagulation.  Patient reports significant family history of blood clots, mother, sister, uncle. Patient denies smoking. Family history positive for breast cancer, paternal aunt and maternal grandmother.  INTERVAL HISTORY Stacey Townsend is a 47 y.o. female who has above history reviewed by me today presents for follow up visit for management of history of DVT Patient is currently taking Eliquis 2.5mg  BID, she intermittently misses doses.  She reports left lower extremity swelling , and right upper thigh pain.  No active bleeding event.  No new complaints.   Review of Systems  Constitutional:  Negative for appetite change, chills,  fatigue and fever.  HENT:   Negative for hearing loss and voice change.   Eyes:  Negative for eye problems.  Respiratory:  Negative for chest tightness and cough.   Cardiovascular:  Positive for leg swelling. Negative for chest pain.  Gastrointestinal:  Negative for abdominal distention, abdominal pain and blood in stool.  Endocrine: Negative for hot flashes.  Genitourinary:  Negative for difficulty urinating and frequency.   Musculoskeletal:   Negative for arthralgias.  Skin:  Negative for itching and rash.  Neurological:  Negative for extremity weakness.  Hematological:  Negative for adenopathy.  Psychiatric/Behavioral:  Negative for confusion.     MEDICAL HISTORY:  Past Medical History:  Diagnosis Date   Asthma    Family history of clotting disorder    History of DVT (deep vein thrombosis)    Hypertension    Osteoarthritis, knee    Supraventricular tachycardia, paroxysmal 2017    SURGICAL HISTORY: Past Surgical History:  Procedure Laterality Date   COLONOSCOPY WITH PROPOFOL N/A 11/04/2021   Procedure: COLONOSCOPY WITH PROPOFOL;  Surgeon: Wyline Mood, MD;  Location: [facility] ENDOSCOPY;  Service: Gastroenterology;  Laterality: N/A;   DILATION AND CURETTAGE OF UTERUS  2013   endometerial ablation  2016   HYSTEROSCOPY  2013    SOCIAL HISTORY: Social History   Socioeconomic History   Marital status: Single    Spouse name: Not on file   Number of children: Not on file   Years of education: Not on file   Highest education level: Not on file  Occupational History   Not on file  Tobacco Use   Smoking status: Never   Smokeless tobacco: Never  Vaping Use   Vaping Use: Former   Devices: only 2-3 months  Substance and Sexual Activity   Alcohol use: Yes    Alcohol/week: 7.0 standard drinks of alcohol    Types: 7 Standard drinks or equivalent per week   Drug use: Never   Sexual activity: Yes  Other Topics Concern   Not on file  Social History Narrative   Not on file   Social Determinants of Health   Financial Resource Strain: Not on file  Food Insecurity: Not on file  Transportation Needs: Not on file  Physical Activity: Not on file  Stress: Not on file  Social Connections: Not on file  Intimate Partner Violence: Not on file    FAMILY HISTORY: Family History  Problem Relation Age of Onset   Deep vein thrombosis Mother    Thyroid disease Mother    Hypertension Father    Diabetes Father    Deep vein  thrombosis Sister    Thyroid disease Sister    Hypertension Sister    Healthy Sister    Breast cancer Paternal Grandmother    Deep vein thrombosis Maternal Uncle    Deep vein thrombosis Paternal Aunt    Breast cancer Paternal Aunt    Deep vein thrombosis Paternal Uncle     ALLERGIES:  is allergic to penicillins.  MEDICATIONS:  Current Outpatient Medications  Medication Sig Dispense Refill   acetaminophen (TYLENOL) 500 MG tablet Take 500 mg by mouth every 8 (eight) hours as needed.     albuterol (VENTOLIN HFA) 108 (90 Base) MCG/ACT inhaler Inhale 2 puffs into the lungs every 4 (four) hours as needed for wheezing or shortness of breath. 1 each 5   baclofen (LIORESAL) 10 MG tablet TAKE ONE-HALF TO ONE TABLET BY MOUTH THREE TIMES DAILY AS NEEDED FOR MUSCLE SPASM 30 tablet 1  cetirizine (ZYRTEC) 10 MG tablet Take 10 mg by mouth daily as needed.     ciclesonide (ALVESCO) 80 MCG/ACT inhaler Inhale 1 puff into the lungs 2 (two) times daily.     lisinopril-hydrochlorothiazide (ZESTORETIC) 20-25 MG tablet Take 1 tablet by mouth daily. 90 tablet 0   lovastatin (MEVACOR) 10 MG tablet TAKE ONE TABLET BY MOUTH AT BEDTIME 30 tablet 2   apixaban (ELIQUIS) 2.5 MG TABS tablet Take 1 tablet (2.5 mg total) by mouth 2 (two) times daily. 180 tablet 1   No current facility-administered medications for this visit.     PHYSICAL EXAMINATION: ECOG PERFORMANCE STATUS: 0 - Asymptomatic Vitals:   02/17/23 1145  BP: (!) 140/76  Pulse: 67  Resp: 18  Temp: 98.1 F (36.7 C)   Filed Weights   02/17/23 1145  Weight: 284 lb (128.8 kg)    Physical Exam Constitutional:      General: She is not in acute distress. HENT:     Head: Normocephalic and atraumatic.  Eyes:     General: No scleral icterus. Cardiovascular:     Rate and Rhythm: Normal rate and regular rhythm.     Heart sounds: Normal heart sounds.  Pulmonary:     Effort: Pulmonary effort is normal. No respiratory distress.     Breath sounds:  No wheezing.  Abdominal:     General: Bowel sounds are normal. There is no distension.     Palpations: Abdomen is soft.  Musculoskeletal:        General: No deformity. Normal range of motion.     Cervical back: Normal range of motion and neck supple.  Skin:    General: Skin is warm and dry.     Findings: No erythema or rash.  Neurological:     Mental Status: She is alert and oriented to person, place, and time. Mental status is at baseline.     Cranial Nerves: No cranial nerve deficit.     Coordination: Coordination normal.  Psychiatric:        Mood and Affect: Mood normal.     LABORATORY DATA:  I have reviewed the data as listed    Latest Ref Rng & Units 02/17/2023   11:32 AM 11/23/2022   10:58 AM 08/15/2022    1:11 PM  CBC  WBC 4.0 - 10.5 K/uL 7.2  6.8  7.4   Hemoglobin 12.0 - 15.0 g/dL 16.1  09.6  04.5   Hematocrit 36.0 - 46.0 % 39.0  40.0  37.7   Platelets 150 - 400 K/uL 217  239  228       Latest Ref Rng & Units 02/17/2023   11:32 AM 11/23/2022   10:58 AM 08/15/2022    1:11 PM  CMP  Glucose 70 - 99 mg/dL 92  409  98   BUN 6 - 20 mg/dL 13  15  21    Creatinine 0.44 - 1.00 mg/dL 8.11  9.14  7.82   Sodium 135 - 145 mmol/L 139  140  139   Potassium 3.5 - 5.1 mmol/L 3.7  4.0  3.3   Chloride 98 - 111 mmol/L 104  104  99   CO2 22 - 32 mmol/L 28  28  30    Calcium 8.9 - 10.3 mg/dL 9.0  9.2  8.9   Total Protein 6.5 - 8.1 g/dL 7.1  6.8  7.2   Total Bilirubin 0.3 - 1.2 mg/dL 0.4  0.3  0.4   Alkaline Phos 38 - 126 U/L 52  52   AST 15 - 41 U/L 18  14  17    ALT 0 - 44 U/L 21  17  21     Iron/TIBC/Ferritin/ %Sat No results found for: "IRON", "TIBC", "FERRITIN", "IRONPCTSAT"    RADIOGRAPHIC STUDIES: I have personally reviewed the radiological images as listed and agreed with the findings in the report. No results found.

## 2023-02-17 NOTE — Assessment & Plan Note (Signed)
Refer to genetic counselor.  

## 2023-02-21 ENCOUNTER — Ambulatory Visit: Payer: BC Managed Care – PPO | Attending: Oncology

## 2023-02-25 ENCOUNTER — Other Ambulatory Visit: Payer: Self-pay | Admitting: Internal Medicine

## 2023-02-27 NOTE — Telephone Encounter (Signed)
Requested Prescriptions  Pending Prescriptions Disp Refills   lisinopril-hydrochlorothiazide (ZESTORETIC) 20-25 MG tablet [Pharmacy Med Name: LISINOPRIL-HCTZ 20-25 MG TAB] 90 tablet 1    Sig: TAKE ONE TABLET BY MOUTH ONE TIME DAILY     Cardiovascular:  ACEI + Diuretic Combos Failed - 02/25/2023  5:52 PM      Failed - Last BP in normal range    BP Readings from Last 1 Encounters:  02/17/23 (!) 140/76         Passed - Na in normal range and within 180 days    Sodium  Date Value Ref Range Status  02/17/2023 139 135 - 145 mmol/L Final         Passed - K in normal range and within 180 days    Potassium  Date Value Ref Range Status  02/17/2023 3.7 3.5 - 5.1 mmol/L Final         Passed - Cr in normal range and within 180 days    Creat  Date Value Ref Range Status  11/23/2022 1.02 (H) 0.50 - 0.99 mg/dL Final   Creatinine, Ser  Date Value Ref Range Status  02/17/2023 0.82 0.44 - 1.00 mg/dL Final         Passed - eGFR is 30 or above and within 180 days    GFR, Estimated  Date Value Ref Range Status  02/17/2023 >60 >60 mL/min Final    Comment:    (NOTE) Calculated using the CKD-EPI Creatinine Equation (2021)    eGFR  Date Value Ref Range Status  11/23/2022 68 > OR = 60 mL/min/1.79m2 Final         Passed - Patient is not pregnant      Passed - Valid encounter within last 6 months    Recent Outpatient Visits           3 months ago Encounter for general adult medical examination with abnormal findings   [place] [facility] [place], Salvadore Oxford, NP   5 months ago Primary hypertension   [place] [facility] [place], Jackelyn Poling, RPH-CPP   5 months ago External hemorrhoid   [place] [facility] [place], Salvadore Oxford, NP   6 months ago Primary hypertension   [place] [facility] Paschal Dopp, [place]   7 months ago Primary hypertension   [place] [place], Salvadore Oxford, NP        Future Appointments             In 3 months Baity, Salvadore Oxford, NP [place] [facility]

## 2023-03-15 ENCOUNTER — Ambulatory Visit: Payer: BC Managed Care – PPO

## 2023-03-15 ENCOUNTER — Inpatient Hospital Stay: Payer: BC Managed Care – PPO | Admitting: Licensed Clinical Social Worker

## 2023-03-15 ENCOUNTER — Inpatient Hospital Stay: Payer: BC Managed Care – PPO

## 2023-03-30 ENCOUNTER — Encounter: Payer: Self-pay | Admitting: Licensed Clinical Social Worker

## 2023-03-30 ENCOUNTER — Inpatient Hospital Stay: Payer: BC Managed Care – PPO

## 2023-03-30 ENCOUNTER — Inpatient Hospital Stay: Payer: BC Managed Care – PPO | Attending: Oncology | Admitting: Licensed Clinical Social Worker

## 2023-03-30 DIAGNOSIS — Z803 Family history of malignant neoplasm of breast: Secondary | ICD-10-CM

## 2023-03-30 NOTE — Progress Notes (Signed)
REFERRING PROVIDER: Rickard Patience, MD [address] [place],  [place] [address]  PRIMARY PROVIDER:  Lorre Munroe, NP  PRIMARY REASON FOR VISIT:  1. Family history of breast cancer      HISTORY OF PRESENT ILLNESS:   Stacey Townsend, a 47 y.o. female, was seen for a [facility] cancer genetics consultation at the request of Dr. Cathie Hoops due to a family history of breast cancer.  Stacey Townsend presents to clinic today to discuss the possibility of a hereditary predisposition to cancer, genetic testing, and to further clarify her future cancer risks, as well as potential cancer risks for family members.   CANCER HISTORY:   Stacey Townsend is a 47 y.o. female with no personal history of cancer.    RISK FACTORS:  Menarche was at age 25.  First live birth at age 89.  Ovaries intact: yes.  Hysterectomy: no.  Menopausal status: premenopausal.  HRT use: 0 years. Colonoscopy: yes;  2023 with Dr. Tobi Bastos, 1 polyp . Mammogram within the last year: yes. Number of breast biopsies: 0.  Past Medical History:  Diagnosis Date   Asthma    Family history of clotting disorder    History of DVT (deep vein thrombosis)    Hypertension    Osteoarthritis, knee    Supraventricular tachycardia, paroxysmal 2017    Past Surgical History:  Procedure Laterality Date   COLONOSCOPY WITH PROPOFOL N/A 11/04/2021   Procedure: COLONOSCOPY WITH PROPOFOL;  Surgeon: Wyline Mood, MD;  Location: [facility] ENDOSCOPY;  Service: Gastroenterology;  Laterality: N/A;   DILATION AND CURETTAGE OF UTERUS  2013   endometerial ablation  2016   HYSTEROSCOPY  2013    FAMILY HISTORY:  We obtained a detailed, 4-generation family history.  Significant diagnoses are listed below: Family History  Problem Relation Age of Onset   Deep vein thrombosis Mother    Thyroid disease Mother    Hypertension Father    Diabetes Father    Deep vein thrombosis Sister    Thyroid disease Sister    Hypertension Sister    Healthy Sister    Deep vein thrombosis  Maternal Uncle    Deep vein thrombosis Paternal Aunt    Breast cancer Paternal Aunt 40       gene+? (half aunt)   Breast cancer Paternal Aunt 27       half aunt   Deep vein thrombosis Paternal Uncle    Breast cancer Paternal Grandmother 63   Stacey Townsend has 1 son, 75, 1 daughter, 55. She has 2 full sisters and 1 maternal half sister, no cancers.  Stacey Townsend mother is living at 43, no known cancers on this side of the family.  Stacey Townsend father is living at 54. He has two paternal half sisters who had breast cancer, one at 4 and possibly tested positive for gene mutation, and one at 65. Patient's paternal grandmother had breast cancer in her 54s-80s and passed at 48.  Stacey Townsend is unaware of previous family history of genetic testing for hereditary cancer risks. There is no reported Ashkenazi Jewish ancestry. There is no known consanguinity.    GENETIC COUNSELING ASSESSMENT: Stacey Townsend is a 47 y.o. female with a family history of early onset breast cancer which is somewhat suggestive of a hereditary cancer syndrome and predisposition to cancer. We, therefore, discussed and recommended the following at today's visit.   DISCUSSION: We discussed that approximately 10% of breast cancer is hereditary. Most cases of hereditary breast cancer are associated with  BRCA1/BRCA2 genes, although there are other genes associated with hereditary cancer as well. Cancers and risks are gene specific. We discussed that testing is beneficial for several reasons including knowing about cancer risks, identifying potential screening and risk-reduction options that may be appropriate, and to understand if other family members could be at risk for cancer and allow them to undergo genetic testing.   We reviewed the characteristics, features and inheritance patterns of hereditary cancer syndromes. We also discussed genetic testing, including the appropriate family members to test, the process of testing,  insurance coverage and turn-around-time for results. We discussed the implications of a negative, positive and/or variant of uncertain significant result. We recommended Stacey Townsend pursue genetic testing for the [facility] Multi-Cancer+RNA gene panel.   Based on Stacey Townsend's family history of cancer, she meets medical criteria for genetic testing. Despite that she meets criteria, she may still have an out of pocket cost.  PLAN: After considering the risks, benefits, and limitations, Stacey Townsend provided informed consent to pursue genetic testing and the blood sample was sent to [facility] for analysis of the Multi-Cancer+RNA panel. Results should be available within approximately 2-3 weeks' time, at which point they will be disclosed by telephone to Stacey Townsend, as will any additional recommendations warranted by these results. Stacey Townsend will receive a summary of her genetic counseling visit and a copy of her results once available. This information will also be available in Epic.   Stacey Townsend questions were answered to her satisfaction today. Our contact information was provided should additional questions or concerns arise. Thank you for the referral and allowing [place] to share in the care of your patient.   Stacey Duverney, MS, [facility] Genetic Counselor [email or web address] Phone: [phone]  The patient was seen for a total of 20 minutes in face-to-face genetic counseling.  Dr. Blake Divine was available for discussion regarding this case.   _______________________________________________________________________ For Office Staff:  Number of people involved in session: 1 Was an Intern/ student involved with case: no

## 2023-04-14 ENCOUNTER — Other Ambulatory Visit: Payer: BC Managed Care – PPO

## 2023-04-14 ENCOUNTER — Ambulatory Visit: Payer: BC Managed Care – PPO

## 2023-04-17 ENCOUNTER — Telehealth: Payer: Self-pay | Admitting: Licensed Clinical Social Worker

## 2023-04-17 ENCOUNTER — Ambulatory Visit: Payer: Self-pay | Admitting: Licensed Clinical Social Worker

## 2023-04-17 ENCOUNTER — Encounter: Payer: Self-pay | Admitting: Licensed Clinical Social Worker

## 2023-04-17 DIAGNOSIS — Z1379 Encounter for other screening for genetic and chromosomal anomalies: Secondary | ICD-10-CM | POA: Insufficient documentation

## 2023-04-17 NOTE — Progress Notes (Signed)
HPI:   Stacey Townsend was previously seen in the [facility] due to a family history of breast cancer and concerns regarding a hereditary predisposition to cancer. Please refer to our prior cancer genetics clinic note for more information regarding our discussion, assessment and recommendations, at the time. Stacey Townsend recent genetic test results were disclosed to her, as were recommendations warranted by these results. These results and recommendations are discussed in more detail below.  CANCER HISTORY:  Oncology History   No history exists.    FAMILY HISTORY:  We obtained a detailed, 4-generation family history.  Significant diagnoses are listed below: Family History  Problem Relation Age of Onset   Deep vein thrombosis Mother    Thyroid disease Mother    Hypertension Father    Diabetes Father    Deep vein thrombosis Sister    Thyroid disease Sister    Hypertension Sister    Healthy Sister    Deep vein thrombosis Maternal Uncle    Deep vein thrombosis Paternal Aunt    Breast cancer Paternal Aunt 40       gene+? (half aunt)   Breast cancer Paternal Aunt 24       half aunt   Deep vein thrombosis Paternal Uncle    Breast cancer Paternal Grandmother 79    Stacey Townsend has 1 son, 24, 1 daughter, 104. She has 2 full sisters and 1 maternal half sister, no cancers.   Stacey Townsend mother is living at 53, no known cancers on this side of the family.   Stacey Townsend father is living at 45. He has two paternal half sisters who had breast cancer, one at 17 and possibly tested positive for gene mutation, and one at 82. Patient's paternal grandmother had breast cancer in her 30s-80s and passed at 12.   Stacey Townsend is unaware of previous family history of genetic testing for hereditary cancer risks. There is no reported Ashkenazi Jewish ancestry. There is no known consanguinity.     GENETIC TEST RESULTS:  The [facility] Multi-Cancer+RNA Panel found no pathogenic  mutations.   The Multi-Cancer + RNA Panel offered by [facility] includes sequencing and/or deletion/duplication analysis of the following 70 genes:  AIP*, ALK, APC*, ATM*, AXIN2*, BAP1*, BARD1*, BLM*, BMPR1A*, BRCA1*, BRCA2*, BRIP1*, CDC73*, CDH1*, CDK4, CDKN1B*, CDKN2A, CHEK2*, CTNNA1*, DICER1*, EPCAM, EGFR, FH*, FLCN*, GREM1, HOXB13, KIT, LZTR1, MAX*, MBD4, MEN1*, MET, MITF, MLH1*, MSH2*, MSH3*, MSH6*, MUTYH*, NF1*, NF2*, NTHL1*, PALB2*, PDGFRA, PMS2*, POLD1*, POLE*, POT1*, PRKAR1A*, PTCH1*, PTEN*, RAD51C*, RAD51D*, RB1*, RET, SDHA*, SDHAF2*, SDHB*, SDHC*, SDHD*, SMAD4*, SMARCA4*, SMARCB1*, SMARCE1*, STK11*, SUFU*, TMEM127*, TP53*, TSC1*, TSC2*, VHL*. RNA analysis is performed for * genes.   The test report has been scanned into EPIC and is located under the Molecular Pathology section of the Results Review tab.  A portion of the result report is included below for reference. Genetic testing reported out on 04/13/2023.      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  At this time, it is unknown if this variant is associated with an increased risk for cancer or if it is benign, but most uncertain variants are reclassified to benign. It should not be used to make medical management decisions. With time, we suspect the laboratory will determine the significance of this variant, if any. If the laboratory reclassifies this variant, we will attempt to contact Stacey Townsend to discuss it further.   Even though a pathogenic variant was not identified, possible  explanations for the cancer in the family may include: There may be no hereditary risk for cancer in the family. The cancers in Stacey Townsend and/or her family may be sporadic/familial or due to other genetic and environmental factors. There may be a gene mutation in one of these genes that current testing methods cannot detect but that chance is small. There could be another gene that has not yet been discovered, or  that we have not yet tested, that is responsible for the cancer diagnoses in the family.  It is also possible there is a hereditary cause for the cancer in the family that Stacey Townsend did not inherit  Therefore, it is important to remain in touch with cancer genetics in the future so that we can continue to offer Stacey Townsend the most up to date genetic testing.   ADDITIONAL GENETIC TESTING:  We discussed with Stacey Townsend that her genetic testing was fairly extensive.  If there are additional relevant genes identified to increase cancer risk that can be analyzed in the future, we would be happy to discuss and coordinate this testing at that time.    CANCER SCREENING RECOMMENDATIONS:  Stacey Townsend test result is considered negative (normal).  This means that we have not identified a hereditary cause for her family history of cancer at this time.   An individual's cancer risk and medical management are not determined by genetic test results alone. Overall cancer risk assessment incorporates additional factors, including personal medical history, family history, and any available genetic information that may result in a personalized plan for cancer prevention and surveillance. Therefore, it is recommended she continue to follow the cancer management and screening guidelines provided by her primary healthcare provider.  RECOMMENDATIONS FOR FAMILY MEMBERS:   Since she did not inherit a identifiable mutation in a cancer predisposition gene included on this panel, her children could not have inherited a known mutation from her in one of these genes. Individuals in this family might be at some increased risk of developing cancer, over the general population risk, due to the family history of cancer.  Individuals in the family should notify their providers of the family history of cancer. We recommend women in this family have a yearly mammogram beginning at age 51, or 57 years younger than the earliest onset  of cancer, an annual clinical breast exam, and perform monthly breast self-exams.  Family members should have colonoscopies by at age 7, or earlier, as recommended by their providers. Other members of the family may still carry a pathogenic variant in one of these genes that Ms. Delgatto did not inherit. Based on the family history, we recommend her paternal cousins, who had breast cancer have genetic counseling and testing. Ms. Brana will let us know if we can be of any assistance in coordinating genetic counseling and/or testing for this family member.   We do not recommend familial testing for the MSH3 variant of uncertain significance (VUS).  FOLLOW-UP:  Lastly, we discussed with Ms. Jacobi that cancer genetics is a rapidly advancing field and it is possible that new genetic tests will be appropriate for her and/or her family members in the future. We encouraged her to remain in contact with cancer genetics on an annual basis so we can update her personal and family histories and let her know of advances in cancer genetics that may benefit this family.   Our contact number was provided. Ms. Petrus questions were answered to her satisfaction, and she knows she  is welcome to call us at anytime with additional questions or concerns.    [name], MS, [facility] Genetic Counselor [email or web address] Phone: [phone]

## 2023-04-17 NOTE — Telephone Encounter (Signed)
I contacted [name] to discuss her genetic testing results. No pathogenic variants were identified in the 70 genes analyzed. Detailed clinic note to follow.   The test report has been scanned into EPIC and is located under the Molecular Pathology section of the Results Review tab.  A portion of the result report is included below for reference.      [name], MS, [facility] Genetic Counselor [name] Phone: [phone]

## 2023-05-12 ENCOUNTER — Other Ambulatory Visit: Payer: BC Managed Care – PPO

## 2023-05-12 ENCOUNTER — Ambulatory Visit: Payer: BC Managed Care – PPO

## 2023-05-31 ENCOUNTER — Encounter: Payer: Self-pay | Admitting: Pharmacist

## 2023-06-07 ENCOUNTER — Ambulatory Visit: Payer: BC Managed Care – PPO | Admitting: Internal Medicine

## 2023-06-28 ENCOUNTER — Ambulatory Visit: Payer: BC Managed Care – PPO | Admitting: Internal Medicine

## 2023-06-28 ENCOUNTER — Encounter: Payer: Self-pay | Admitting: Internal Medicine

## 2023-06-28 VITALS — BP 124/84 | HR 85 | Ht 68.0 in | Wt 279.0 lb

## 2023-06-28 DIAGNOSIS — E7800 Pure hypercholesterolemia, unspecified: Secondary | ICD-10-CM | POA: Diagnosis not present

## 2023-06-28 DIAGNOSIS — M5441 Lumbago with sciatica, right side: Secondary | ICD-10-CM

## 2023-06-28 DIAGNOSIS — J4540 Moderate persistent asthma, uncomplicated: Secondary | ICD-10-CM

## 2023-06-28 DIAGNOSIS — G8929 Other chronic pain: Secondary | ICD-10-CM

## 2023-06-28 DIAGNOSIS — Z86718 Personal history of other venous thrombosis and embolism: Secondary | ICD-10-CM

## 2023-06-28 DIAGNOSIS — R7303 Prediabetes: Secondary | ICD-10-CM

## 2023-06-28 DIAGNOSIS — I10 Essential (primary) hypertension: Secondary | ICD-10-CM

## 2023-06-28 DIAGNOSIS — M25562 Pain in left knee: Secondary | ICD-10-CM | POA: Diagnosis not present

## 2023-06-28 DIAGNOSIS — I471 Supraventricular tachycardia, unspecified: Secondary | ICD-10-CM

## 2023-06-28 MED ORDER — BACLOFEN 10 MG PO TABS
10.0000 mg | ORAL_TABLET | Freq: Every day | ORAL | 1 refills | Status: AC | PRN
Start: 2023-06-28 — End: ?

## 2023-06-28 NOTE — Assessment & Plan Note (Signed)
Continue albuterol as needed

## 2023-06-28 NOTE — Assessment & Plan Note (Signed)
A1c today Encourage low-carb diet and exercise for weight loss

## 2023-06-28 NOTE — Assessment & Plan Note (Signed)
Controlled on lisinopril HCT Reinforced diet and exercise for weight loss

## 2023-06-28 NOTE — Patient Instructions (Signed)

## 2023-06-28 NOTE — Progress Notes (Signed)
Subjective:    Patient ID: Stacey Townsend, female    DOB: Aug 31, 1976, 47 y.o.   MRN: [id number]  HPI  Patient presents the clinic today for 14-month follow-up of chronic conditions.  Asthma: Moderate, persistent.  She is taking ciclesonide and albuterol as prescribed.  There are no PFTs on file.  She does not follow with pulmonology.  HTN: Her BP today is 124/84.  She is taking lisinopril HCT as prescribed.  There is no ECG on file.  History of left DVT, family history of clotting disorder: Managed with eliquis.  She follows with hematology.  Chronic back/knee pain.  Managed with tylenol arthritis and baclofen as prescribed.  She does not follow with orthopedics.  HLD: Her last LDL was 132, triglycerides 188, 11/2022.  She denies myalgias on lovastatin.  She tries to consume a low-fat diet.  Prediabetes: Her last A1c was 6.1%, 11/2022.  She is not taking any oral diabetic medication this time.  She does not check her sugars.  Review of Systems     Past Medical History:  Diagnosis Date   Asthma    Family history of clotting disorder    History of DVT (deep vein thrombosis)    Hypertension    Osteoarthritis, knee    Supraventricular tachycardia, paroxysmal 2017    Current Outpatient Medications  Medication Sig Dispense Refill   acetaminophen (TYLENOL) 500 MG tablet Take 500 mg by mouth every 8 (eight) hours as needed.     albuterol (VENTOLIN HFA) 108 (90 Base) MCG/ACT inhaler Inhale 2 puffs into the lungs every 4 (four) hours as needed for wheezing or shortness of breath. 1 each 5   apixaban (ELIQUIS) 2.5 MG TABS tablet Take 1 tablet (2.5 mg total) by mouth 2 (two) times daily. 180 tablet 1   baclofen (LIORESAL) 10 MG tablet TAKE ONE-HALF TO ONE TABLET BY MOUTH THREE TIMES DAILY AS NEEDED FOR MUSCLE SPASM 30 tablet 1   cetirizine (ZYRTEC) 10 MG tablet Take 10 mg by mouth daily as needed.     ciclesonide (ALVESCO) 80 MCG/ACT inhaler Inhale 1 puff into the lungs 2 (two) times  daily.     lisinopril-hydrochlorothiazide (ZESTORETIC) 20-25 MG tablet TAKE ONE TABLET BY MOUTH ONE TIME DAILY 90 tablet 1   lovastatin (MEVACOR) 10 MG tablet TAKE ONE TABLET BY MOUTH AT BEDTIME 30 tablet 2   No current facility-administered medications for this visit.    Allergies  Allergen Reactions   Penicillins Shortness Of Breath    Hives and vomitting    Family History  Problem Relation Age of Onset   Deep vein thrombosis Mother    Thyroid disease Mother    Hypertension Father    Diabetes Father    Deep vein thrombosis Sister    Thyroid disease Sister    Hypertension Sister    Healthy Sister    Deep vein thrombosis Maternal Uncle    Deep vein thrombosis Paternal Aunt    Breast cancer Paternal Aunt 40       gene+? (half aunt)   Breast cancer Paternal Aunt 70       half aunt   Deep vein thrombosis Paternal Uncle    Breast cancer Paternal Grandmother 37    Social History   Socioeconomic History   Marital status: Single    Spouse name: Not on file   Number of children: Not on file   Years of education: Not on file   Highest education level: Not on file  Occupational  History   Not on file  Tobacco Use   Smoking status: Never   Smokeless tobacco: Never  Vaping Use   Vaping status: Former   Devices: only 2-3 months  Substance and Sexual Activity   Alcohol use: Yes    Alcohol/week: 7.0 standard drinks of alcohol    Types: 7 Standard drinks or equivalent per week   Drug use: Never   Sexual activity: Yes  Other Topics Concern   Not on file  Social History Narrative   Not on file   Social Determinants of Health   Financial Resource Strain: Not on file  Food Insecurity: Not on file  Transportation Needs: Not on file  Physical Activity: Not on file  Stress: Not on file  Social Connections: Not on file  Intimate Partner Violence: Not on file     Constitutional: Denies fever, malaise, fatigue, headache or abrupt weight changes.  HEENT: Denies eye pain,  eye redness, ear pain, ringing in the ears, wax buildup, runny nose, nasal congestion, bloody nose, or sore throat. Respiratory: Denies difficulty breathing, shortness of breath, cough or sputum production.   Cardiovascular: Patient reports intermittent swelling in legs.  Denies chest pain, chest tightness, palpitations or swelling in the hands.  Gastrointestinal: Denies abdominal pain, bloating, constipation, diarrhea or blood in the stool.  GU: Denies urgency, frequency, pain with urination, burning sensation, blood in urine, odor or discharge. Musculoskeletal: Patient reports back/knee pain.  Denies decrease in range of motion, difficulty with gait, muscle pain or joint swelling.  Skin: Denies redness, rashes, lesions or ulcercations.  Neurological: Denies dizziness, difficulty with memory, difficulty with speech or problems with balance and coordination.  Psych: Denies anxiety, depression, SI/HI.  No other specific complaints in a complete review of systems (except as listed in HPI above).  Objective:   Physical Exam  BP 124/84   Pulse 85   Ht 5\' 8"  (1.727 m)   Wt 279 lb (126.6 kg)   LMP 06/12/2023   SpO2 97%   BMI 42.42 kg/m   Wt Readings from Last 3 Encounters:  02/17/23 284 lb (128.8 kg)  11/23/22 279 lb (126.6 kg)  10/13/22 281 lb 3.2 oz (127.6 kg)    General: Appears her stated age, obese in NAD. Skin: Warm, dry and intact.  HEENT: Head: normal shape and size; Eyes: sclera white, no icterus, conjunctiva pink, PERRLA and EOMs intact;  Cardiovascular: Normal rate and rhythm. S1,S2 noted.  No murmur, rubs or gallops noted. No JVD or BLE edema.  Pulmonary/Chest: Normal effort and positive vesicular breath sounds. No respiratory distress. No wheezes, rales or ronchi noted.  Musculoskeletal: No difficulty with gait.  Neurological: Alert and oriented. Coordination normal.  Psychiatric: Mood and affect normal. Behavior is normal. Judgment and thought content normal.    BMET     Component Value Date/Time   NA 139 02/17/2023 1132   K 3.7 02/17/2023 1132   CL 104 02/17/2023 1132   CO2 28 02/17/2023 1132   GLUCOSE 92 02/17/2023 1132   BUN 13 02/17/2023 1132   CREATININE 0.82 02/17/2023 1132   CREATININE 1.02 (H) 11/23/2022 1058   CALCIUM 9.0 02/17/2023 1132   GFRNONAA >60 02/17/2023 1132    Lipid Panel     Component Value Date/Time   CHOL 222 (H) 11/23/2022 1058   TRIG 188 (H) 11/23/2022 1058   HDL 58 11/23/2022 1058   CHOLHDL 3.8 11/23/2022 1058   LDLCALC 132 (H) 11/23/2022 1058    CBC    Component  Value Date/Time   WBC 7.2 02/17/2023 1132   RBC 4.95 02/17/2023 1132   HGB 12.4 02/17/2023 1132   HCT 39.0 02/17/2023 1132   PLT 217 02/17/2023 1132   MCV 78.8 (L) 02/17/2023 1132   MCH 25.1 (L) 02/17/2023 1132   MCHC 31.8 02/17/2023 1132   RDW 15.3 02/17/2023 1132   LYMPHSABS 2.1 02/17/2023 1132   MONOABS 0.5 02/17/2023 1132   EOSABS 0.1 02/17/2023 1132   BASOSABS 0.1 02/17/2023 1132    Hgb A1C Lab Results  Component Value Date   HGBA1C 6.1 (H) 11/23/2022           Assessment & Plan:      RTC in 6 months for your annual exam Nicki Reaper, NP

## 2023-06-28 NOTE — Assessment & Plan Note (Addendum)
Encouraged regular stretching and core strengthening Encourage weight loss as this can help reduce back pain Continue Tylenol and baclofen

## 2023-06-28 NOTE — Assessment & Plan Note (Signed)
Continue Eliquis She will continue to follow with hematology

## 2023-06-28 NOTE — Assessment & Plan Note (Signed)
Encourage weight loss as this can help reduce joint pain Continue baclofen and Tylenol

## 2023-06-28 NOTE — Assessment & Plan Note (Signed)
C-Met and lipid profile today Encouraged her to consume a low-fat diet Continue lovastatin

## 2023-06-28 NOTE — Assessment & Plan Note (Signed)
Not currently on beta-blocker We will monitor

## 2023-06-29 LAB — HEMOGLOBIN A1C
Hgb A1c MFr Bld: 6.2 %{Hb} — ABNORMAL HIGH (ref <5.7)
Mean Plasma Glucose: 131 mg/dL
eAG (mmol/L): 7.3 mmol/L

## 2023-06-29 LAB — LIPID PANEL
Cholesterol: 206 mg/dL — ABNORMAL HIGH (ref <200)
HDL: 55 mg/dL (ref >=50)
LDL Cholesterol (Calc): 129 mg/dL — ABNORMAL HIGH
Non-HDL Cholesterol (Calc): 151 mg/dL — ABNORMAL HIGH (ref <130)
Total CHOL/HDL Ratio: 3.7 (calc) (ref <5.0)
Triglycerides: 114 mg/dL (ref <150)

## 2023-08-18 ENCOUNTER — Other Ambulatory Visit: Payer: BC Managed Care – PPO

## 2023-08-18 ENCOUNTER — Ambulatory Visit: Payer: BC Managed Care – PPO | Admitting: Oncology

## 2023-08-23 ENCOUNTER — Inpatient Hospital Stay: Payer: BC Managed Care – PPO | Attending: Oncology

## 2023-08-23 ENCOUNTER — Inpatient Hospital Stay (HOSPITAL_BASED_OUTPATIENT_CLINIC_OR_DEPARTMENT_OTHER): Payer: BC Managed Care – PPO | Admitting: Oncology

## 2023-08-23 ENCOUNTER — Encounter: Payer: Self-pay | Admitting: Oncology

## 2023-08-23 VITALS — BP 142/88 | HR 85 | Temp 97.8°F | Resp 18 | Wt 271.4 lb

## 2023-08-23 DIAGNOSIS — Z7901 Long term (current) use of anticoagulants: Secondary | ICD-10-CM | POA: Diagnosis not present

## 2023-08-23 DIAGNOSIS — Z86718 Personal history of other venous thrombosis and embolism: Secondary | ICD-10-CM

## 2023-08-23 DIAGNOSIS — E876 Hypokalemia: Secondary | ICD-10-CM | POA: Insufficient documentation

## 2023-08-23 DIAGNOSIS — Z809 Family history of malignant neoplasm, unspecified: Secondary | ICD-10-CM

## 2023-08-23 LAB — CBC WITH DIFFERENTIAL (CANCER CENTER ONLY)
Abs Immature Granulocytes: 0.03 10*3/uL (ref 0.00–0.07)
Basophils Absolute: 0.1 10*3/uL (ref 0.0–0.1)
Basophils Relative: 1 %
Eosinophils Absolute: 0.1 10*3/uL (ref 0.0–0.5)
Eosinophils Relative: 2 %
HCT: 41.1 % (ref 36.0–46.0)
Hemoglobin: 13.2 g/dL (ref 12.0–15.0)
Immature Granulocytes: 0 %
Lymphocytes Relative: 35 %
Lymphs Abs: 2.6 10*3/uL (ref 0.7–4.0)
MCH: 25.1 pg — ABNORMAL LOW (ref 26.0–34.0)
MCHC: 32.1 g/dL (ref 30.0–36.0)
MCV: 78.3 fL — ABNORMAL LOW (ref 80.0–100.0)
Monocytes Absolute: 0.3 10*3/uL (ref 0.1–1.0)
Monocytes Relative: 4 %
Neutro Abs: 4.4 10*3/uL (ref 1.7–7.7)
Neutrophils Relative %: 58 %
Platelet Count: 237 10*3/uL (ref 150–400)
RBC: 5.25 MIL/uL — ABNORMAL HIGH (ref 3.87–5.11)
RDW: 15.1 % (ref 11.5–15.5)
WBC Count: 7.5 10*3/uL (ref 4.0–10.5)
nRBC: 0 % (ref 0.0–0.2)

## 2023-08-23 LAB — CMP (CANCER CENTER ONLY)
ALT: 19 U/L (ref 0–44)
AST: 17 U/L (ref 15–41)
Albumin: 4.2 g/dL (ref 3.5–5.0)
Alkaline Phosphatase: 52 U/L (ref 38–126)
Anion gap: 9 (ref 5–15)
BUN: 16 mg/dL (ref 6–20)
CO2: 29 mmol/L (ref 22–32)
Calcium: 8.9 mg/dL (ref 8.9–10.3)
Chloride: 99 mmol/L (ref 98–111)
Creatinine: 1.04 mg/dL — ABNORMAL HIGH (ref 0.44–1.00)
GFR, Estimated: >60 mL/min (ref >60)
Glucose, Bld: 115 mg/dL — ABNORMAL HIGH (ref 70–99)
Potassium: 3.4 mmol/L — ABNORMAL LOW (ref 3.5–5.1)
Sodium: 137 mmol/L (ref 135–145)
Total Bilirubin: 0.5 mg/dL (ref <1.2)
Total Protein: 7.3 g/dL (ref 6.5–8.1)

## 2023-08-23 MED ORDER — APIXABAN 2.5 MG PO TABS: 2.5000 mg | ORAL_TABLET | Freq: Two times a day (BID) | ORAL | 1 refills | Status: DC

## 2023-08-23 NOTE — Assessment & Plan Note (Signed)
Recommend patient to take potassium rich food.  Potassium is slightly decreased at 3.4.

## 2023-08-23 NOTE — Assessment & Plan Note (Addendum)
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 Negative anticardiolipin IgG/IgM.  10/07/2021 positive lupus anticoagulant 02/04/2022, repeat testing showed negative lupus anticoagulant.  Not meeting APS diagnosis criteria June 2023-present time she is on Eliquis 2.5mg  BID for long-term anticoagulation prophylaxis.  Continue current Eliquis Check [place] lower extremities for further work up of left LE swelling and right LE pain-not done after last visit.  She is interested to reschedule

## 2023-08-23 NOTE — Progress Notes (Signed)
Hematology/Oncology Progress note [phone] [phone]         Patient Care Team: Lorre Munroe, NP as PCP - General (Internal Medicine) Rickard Patience, MD as Consulting Physician (Hematology and Oncology) Manuela Neptune, RPH-CPP as Pharmacist  ASSESSMENT & PLAN:   History of DVT (deep vein thrombosis) #Unprovoked DVT March 2022- June 2023 -Pradaxa  Hypercoagulable work up negative: negative Factor 5 leiden mutation, negative prothrombin gene mutation. Negative anticardiolipin IgG/IgM.  10/07/2021 positive lupus anticoagulant 02/04/2022, repeat testing showed negative lupus anticoagulant.  Not meeting APS diagnosis criteria June 2023-present time she is on Eliquis 2.5mg  BID for long-term anticoagulation prophylaxis.  Continue current Eliquis Check [place] lower extremities for further work up of left LE swelling and right LE pain-not done after last visit.  She is interested to reschedule  Family history of cancer Negative genetic testing.   Hypokalemia Recommend patient to take potassium rich food.  Potassium is slightly decreased at 3.4.   Orders Placed This Encounter  Procedures   CMP (Cancer Center only)    Standing Status:   Future    Standing Expiration Date:   08/22/2024   CBC with Differential (Cancer Center Only)    Standing Status:   Future    Standing Expiration Date:   08/22/2024   Follow up in 6 months.  All questions were answered. The patient knows to call the clinic with any problems, questions or concerns.  Rickard Patience, MD, PhD [facility] Hematology Oncology 08/23/2023   CHIEF COMPLAINTS/REASON FOR VISIT:   history of DVT  HISTORY OF PRESENTING ILLNESS:   Stacey Townsend is a  47 y.o.  female with PMH listed below was seen in consultation at the request of  Lorre Munroe, NP  for evaluation of history of DVT  12/02/2020, patient presented to urgent care for evaluation of left lower extremity pain.  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  Patient was started on Lovenox bridged Pradaxa 150 mg twice daily.  Patient tolerates anticoagulation.  There was no immobilization factors prior to her symptom presentation. She had COVID 19 infection in January 2022.  Patient moved to [place] from [place] in July 2022.  She has establish care with primary care provider and was referred to establish care with hematology for management of history of DVT and anticoagulation.  Patient reports significant family history of blood clots, mother, sister, uncle. Patient denies smoking. Family history positive for breast cancer, paternal aunt and maternal grandmother.  INTERVAL HISTORY Stacey Townsend is a 47 y.o. female who has above history reviewed by me today presents for follow up visit for management of history of DVT Patient is currently taking Eliquis 2.5mg  BID, she intermittently misses doses.  She reports left lower extremity swelling , and right upper thigh pain.  No active bleeding event.  No new complaints.   Review of Systems  Constitutional:  Negative for appetite change, chills, fatigue and fever.  HENT:   Negative for hearing loss and voice change.   Eyes:  Negative for eye problems.  Respiratory:  Negative for chest tightness and cough.   Cardiovascular:  Positive for leg swelling. Negative for chest pain.  Gastrointestinal:  Negative for abdominal distention, abdominal pain and blood in stool.  Endocrine: Negative for hot flashes.  Genitourinary:  Negative for difficulty urinating and frequency.   Musculoskeletal:  Negative for arthralgias.  Skin:  Negative for itching and rash.  Neurological:  Negative for extremity weakness.  Hematological:  Negative for adenopathy.  Psychiatric/Behavioral:  Negative for confusion.     MEDICAL HISTORY:  Past Medical History:  Diagnosis Date   Asthma    Family history of clotting disorder     History of DVT (deep vein thrombosis)    Hypertension    Osteoarthritis, knee    Supraventricular tachycardia, paroxysmal (HCC) 2017    SURGICAL HISTORY: Past Surgical History:  Procedure Laterality Date   COLONOSCOPY WITH PROPOFOL N/A 11/04/2021   Procedure: COLONOSCOPY WITH PROPOFOL;  Surgeon: Wyline Mood, MD;  [facility] ENDOSCOPY;  Service: Gastroenterology;  Laterality: N/A;   DILATION AND CURETTAGE OF UTERUS  2013   endometerial ablation  2016   HYSTEROSCOPY  2013    SOCIAL HISTORY: Social History   Socioeconomic History   Marital status: Single    Spouse name: Not on file   Number of children: Not on file   Years of education: Not on file   Highest education level: Not on file  Occupational History   Not on file  Tobacco Use   Smoking status: Never   Smokeless tobacco: Never  Vaping Use   Vaping status: Former   Devices: only 2-3 months  Substance and Sexual Activity   Alcohol use: Yes    Alcohol/week: 7.0 standard drinks of alcohol    Types: 7 Standard drinks or equivalent per week   Drug use: Never   Sexual activity: Yes  Other Topics Concern   Not on file  Social History Narrative   Not on file   Social Determinants of Health   Financial Resource Strain: Not on file  Food Insecurity: Not on file  Transportation Needs: Not on file  Physical Activity: Not on file  Stress: Not on file  Social Connections: Not on file  Intimate Partner Violence: Not on file    FAMILY HISTORY: Family History  Problem Relation Age of Onset   Deep vein thrombosis Mother    Thyroid disease Mother    Hypertension Father    Diabetes Father    Deep vein thrombosis Sister    Thyroid disease Sister    Hypertension Sister    Healthy Sister    Deep vein thrombosis Maternal Uncle    Deep vein thrombosis Paternal Aunt    Breast cancer Paternal Aunt 40       gene+? (half aunt)   Breast cancer Paternal Aunt 36       half aunt   Deep vein thrombosis Paternal Uncle     Breast cancer Paternal Grandmother 15    ALLERGIES:  is allergic to penicillins.  MEDICATIONS:  Current Outpatient Medications  Medication Sig Dispense Refill   acetaminophen (TYLENOL) 500 MG tablet Take 500 mg by mouth every 8 (eight) hours as needed.     albuterol (VENTOLIN HFA) 108 (90 Base) MCG/ACT inhaler Inhale 2 puffs into the lungs every 4 (four) hours as needed for wheezing or shortness of breath. 1 each 5   baclofen (LIORESAL) 10 MG tablet Take 1 tablet (10 mg total) by mouth daily as needed for muscle spasms. 90 tablet 1   betamethasone dipropionate 0.05 % lotion Apply topically 2 (two) times daily.     cetirizine (ZYRTEC) 10 MG tablet Take 10 mg by mouth daily as needed.     ciclesonide (ALVESCO) 80 MCG/ACT inhaler Inhale 1 puff into the lungs 2 (two) times daily.     lisinopril-hydrochlorothiazide (ZESTORETIC) 20-25 MG tablet TAKE ONE TABLET BY MOUTH ONE TIME  DAILY 90 tablet 1   lovastatin (MEVACOR) 10 MG tablet TAKE ONE TABLET BY MOUTH AT BEDTIME 30 tablet 2   tacrolimus (PROTOPIC) 0.1 % ointment Apply 1 Application topically 2 (two) times daily.     triamcinolone (KENALOG) 0.025 % cream Apply 1 Application topically 2 (two) times daily.     apixaban (ELIQUIS) 2.5 MG TABS tablet Take 1 tablet (2.5 mg total) by mouth 2 (two) times daily. 180 tablet 1   No current facility-administered medications for this visit.     PHYSICAL EXAMINATION: ECOG PERFORMANCE STATUS: 0 - Asymptomatic Vitals:   08/23/23 1052  BP: (!) 142/88  Pulse: 85  Resp: 18  Temp: 97.8 F (36.6 C)   Filed Weights   08/23/23 1052  Weight: 271 lb 6.4 oz (123.1 kg)    Physical Exam Constitutional:      General: She is not in acute distress. HENT:     Head: Normocephalic and atraumatic.  Eyes:     General: No scleral icterus. Cardiovascular:     Rate and Rhythm: Normal rate and regular rhythm.     Heart sounds: Normal heart sounds.  Pulmonary:     Effort: Pulmonary effort is normal. No  respiratory distress.     Breath sounds: No wheezing.  Abdominal:     General: Bowel sounds are normal. There is no distension.     Palpations: Abdomen is soft.  Musculoskeletal:        General: No deformity. Normal range of motion.     Cervical back: Normal range of motion and neck supple.  Skin:    General: Skin is warm and dry.     Findings: No erythema or rash.  Neurological:     Mental Status: She is alert and oriented to person, place, and time. Mental status is at baseline.     Cranial Nerves: No cranial nerve deficit.     Coordination: Coordination normal.  Psychiatric:        Mood and Affect: Mood normal.     LABORATORY DATA:  I have reviewed the data as listed    Latest Ref Rng & Units 08/23/2023   10:28 AM 02/17/2023   11:32 AM 11/23/2022   10:58 AM  CBC  WBC 4.0 - 10.5 K/uL 7.5  7.2  6.8   Hemoglobin 12.0 - 15.0 g/dL 16.1  09.6  04.5   Hematocrit 36.0 - 46.0 % 41.1  39.0  40.0   Platelets 150 - 400 K/uL 237  217  239       Latest Ref Rng & Units 08/23/2023   10:28 AM 02/17/2023   11:32 AM 11/23/2022   10:58 AM  CMP  Glucose 70 - 99 mg/dL 409  92  811   BUN 6 - 20 mg/dL 16  13  15    Creatinine 0.44 - 1.00 mg/dL 9.14  7.82  9.56   Sodium 135 - 145 mmol/L 137  139  140   Potassium 3.5 - 5.1 mmol/L 3.4  3.7  4.0   Chloride 98 - 111 mmol/L 99  104  104   CO2 22 - 32 mmol/L 29  28  28    Calcium 8.9 - 10.3 mg/dL 8.9  9.0  9.2   Total Protein 6.5 - 8.1 g/dL 7.3  7.1  6.8   Total Bilirubin <1.2 mg/dL 0.5  0.4  0.3   Alkaline Phos 38 - 126 U/L 52  52    AST 15 - 41 U/L 17  18  14   ALT 0 - 44 U/L 19  21  17     Iron/TIBC/Ferritin/ %Sat No results found for: "IRON", "TIBC", "FERRITIN", "IRONPCTSAT"    RADIOGRAPHIC STUDIES: I have personally reviewed the radiological images as listed and agreed with the findings in the report. No results found.

## 2023-08-23 NOTE — Assessment & Plan Note (Signed)
Negative genetic testing

## 2023-09-20 ENCOUNTER — Ambulatory Visit
Admission: RE | Admit: 2023-09-20 | Discharge: 2023-09-20 | Disposition: A | Discharge status: Home / Self Care | Payer: BC Managed Care – PPO | Source: Ambulatory Visit | Attending: Oncology | Admitting: Oncology

## 2023-09-20 DIAGNOSIS — M79605 Pain in left leg: Secondary | ICD-10-CM | POA: Diagnosis present

## 2023-09-20 DIAGNOSIS — Z86718 Personal history of other venous thrombosis and embolism: Secondary | ICD-10-CM | POA: Insufficient documentation

## 2023-09-20 DIAGNOSIS — M79604 Pain in right leg: Secondary | ICD-10-CM | POA: Diagnosis present

## 2023-10-06 ENCOUNTER — Telehealth: Payer: Self-pay | Admitting: *Deleted

## 2023-10-06 ENCOUNTER — Other Ambulatory Visit: Payer: Self-pay

## 2023-10-06 DIAGNOSIS — Z86718 Personal history of other venous thrombosis and embolism: Secondary | ICD-10-CM

## 2023-10-06 DIAGNOSIS — M79604 Pain in right leg: Secondary | ICD-10-CM

## 2023-10-06 NOTE — Telephone Encounter (Signed)
Please schedule appt with Dr. Barbaraann Cao and inform pt of appt

## 2023-10-06 NOTE — Telephone Encounter (Signed)
Patient called today saying that since she did not have a DVT and she still having a lot of pain her leg and she would like to see a neurologist.  Patient is requesting to do a referral to a neurologist.

## 2023-10-11 ENCOUNTER — Telehealth: Payer: Self-pay | Admitting: Oncology

## 2023-10-11 NOTE — Telephone Encounter (Signed)
Given that pt has no oncology issues, we will send referral to [facility] Neurology. Please cancel appt with Dr. Barbaraann Cao.

## 2023-10-11 NOTE — Telephone Encounter (Signed)
IB received from Dr. Bethanne Ginger RN 10/11/23 with request to cancel and close referral for neuro/oncology with Dr. Barbaraann Cao. Per RN, patient needs to see general neurology and that referral has been entered. VM left with patient to let her know the consult here at the cancer center has been cancelled and to be expecting a call from another office to schedule appointment with general neuro.

## 2023-10-13 NOTE — Telephone Encounter (Signed)
Received fax back from [facility] neuro stating that per provider pt will need to be referred to vein and vascular.   Referral faxed to [facility]. Spoke to pt and informed her of this referral change. Pt verbalized understanding.

## 2023-10-18 ENCOUNTER — Other Ambulatory Visit: Payer: Self-pay | Admitting: Internal Medicine

## 2023-10-18 DIAGNOSIS — E782 Mixed hyperlipidemia: Secondary | ICD-10-CM

## 2023-10-19 ENCOUNTER — Other Ambulatory Visit: Payer: Self-pay | Admitting: Internal Medicine

## 2023-10-19 DIAGNOSIS — E782 Mixed hyperlipidemia: Secondary | ICD-10-CM

## 2023-10-19 NOTE — Telephone Encounter (Signed)
 Requested Prescriptions  Pending Prescriptions Disp Refills   lovastatin  (MEVACOR ) 10 MG tablet [Pharmacy Med Name: LOVASTATIN  10 MG TAB[*]] 30 tablet 2    Sig: TAKE ONE TABLET BY MOUTH AT BEDTIME     Cardiovascular:  Antilipid - Statins 2 Failed - 10/19/2023  4:14 PM      Failed - Cr in normal range and within 360 days    Creatinine  Date Value Ref Range Status  08/23/2023 1.04 (H) 0.44 - 1.00 mg/dL Final   Creat  Date Value Ref Range Status  11/23/2022 1.02 (H) 0.50 - 0.99 mg/dL Final         Failed - Lipid Panel in normal range within the last 12 months    Cholesterol  Date Value Ref Range Status  06/28/2023 206 (H) <200 mg/dL Final   LDL Cholesterol (Calc)  Date Value Ref Range Status  06/28/2023 129 (H) mg/dL (calc) Final    Comment:    Reference range: <100 . Desirable range <100 mg/dL for primary prevention;   <70 mg/dL for patients with CHD or diabetic patients  with > or = 2 CHD risk factors. SABRA LDL-C is now calculated using the Martin-Hopkins  calculation, which is a validated novel method providing  better accuracy than the Friedewald equation in the  estimation of LDL-C.  Gladis APPLETHWAITE et al. SANDREA. 7986;689(80): 2061-2068  (http://education.[employer].com/faq/FAQ164)    HDL  Date Value Ref Range Status  06/28/2023 55 > OR = 50 mg/dL Final   Triglycerides  Date Value Ref Range Status  06/28/2023 114 <150 mg/dL Final         Passed - Patient is not pregnant      Passed - Valid encounter within last 12 months    Recent Outpatient Visits           3 months ago Pure hypercholesterolemia   [place] [facility] [place], [place] W, NP   11 months ago Encounter for general adult medical examination with abnormal findings   [place] [facility] [facility] [place], Angeline ORN, NP   1 year ago Primary hypertension   [place] [facility] [place], Sharyle LABOR, RPH-CPP   1 year ago External hemorrhoid   [place] [facility] [place], Angeline ORN, NP   1 year ago Primary hypertension   [place] [facility] Walsh, Laura E, CMA               lisinopril -hydrochlorothiazide  (ZESTORETIC ) 20-25 MG tablet [Pharmacy Med Name: LISINOPRIL -HCTZ 20-25 MG TAB[*]] 90 tablet 0    Sig: TAKE ONE TABLET BY MOUTH ONE TIME DAILY     Cardiovascular:  ACEI + Diuretic Combos Failed - 10/19/2023  4:14 PM      Failed - K in normal range and within 180 days    Potassium  Date Value Ref Range Status  08/23/2023 3.4 (L) 3.5 - 5.1 mmol/L Final         Failed - Cr in normal range and within 180 days    Creatinine  Date Value Ref Range Status  08/23/2023 1.04 (H) 0.44 - 1.00 mg/dL Final   Creat  Date Value Ref Range Status  11/23/2022 1.02 (H) 0.50 - 0.99 mg/dL Final         Failed - Last BP in normal range    BP Readings from Last 1 Encounters:  08/23/23 (!) 142/88         Passed - Na in  normal range and within 180 days    Sodium  Date Value Ref Range Status  08/23/2023 137 135 - 145 mmol/L Final         Passed - eGFR is 30 or above and within 180 days    GFR, Estimated  Date Value Ref Range Status  08/23/2023 >60 >60 mL/min Final    Comment:    (NOTE) Calculated using the CKD-EPI Creatinine Equation (2021)    eGFR  Date Value Ref Range Status  11/23/2022 68 > OR = 60 mL/min/1.59m2 Final         Passed - Patient is not pregnant      Passed - Valid encounter within last 6 months    Recent Outpatient Visits           3 months ago Pure hypercholesterolemia   [place] [facility] [place], Angeline ORN, NP   11 months ago Encounter for general adult medical examination with abnormal findings   [place] [facility] [place], Angeline ORN, NP   1 year ago Primary hypertension   [place] [facility] D[place], Sharyle LABOR, RPH-CPP   1 year ago External hemorrhoid   [place] [facility] [place], Angeline ORN, NP   1 year  ago Primary hypertension   [place] [facility] Hope Leita BRAVO, CMA

## 2023-10-19 NOTE — Telephone Encounter (Signed)
 Medication Refill -  Most Recent Primary Care Visit:  Provider: ANTONETTE ANGELINE ORN  Department: [id number]  Visit Type: OFFICE VISIT  Date: 06/28/2023  Medication: lisinopril -hydrochlorothiazide  (ZESTORETIC ) 20-25 MG tablet [[id number]] lovastatin  (MEVACOR ) 10 MG tablet [[id number]]   Has the patient contacted their pharmacy? Yes  (Agent: If yes, when and what did the pharmacy advise?) Contact office no refills   Is this the correct pharmacy for this prescription? Yes   This is the patient's preferred pharmacy:  [facility] [address] [place] - [place], [place] - [address] [facility] [address] [place] [address] Phone: [phone] Fax: [phone]   Has the prescription been filled recently? Yes  Is the patient out of the medication? Yes  Has the patient been seen for an appointment in the last year OR does the patient have an upcoming appointment? Yes  Can we respond through MyChart? Yes  Agent: Please be advised that Rx refills may take up to 3 business days. We ask that you follow-up with your pharmacy.

## 2023-10-20 NOTE — Telephone Encounter (Signed)
 Request was refilled 10/18/22 for 90 days, duplicate request. Requested Prescriptions  Pending Prescriptions Disp Refills   lisinopril -hydrochlorothiazide  (ZESTORETIC ) 20-25 MG tablet 90 tablet 0    Sig: Take 1 tablet by mouth daily.     Cardiovascular:  ACEI + Diuretic Combos Failed - 10/20/2023 11:35 AM      Failed - K in normal range and within 180 days    Potassium  Date Value Ref Range Status  08/23/2023 3.4 (L) 3.5 - 5.1 mmol/L Final         Failed - Cr in normal range and within 180 days    Creatinine  Date Value Ref Range Status  08/23/2023 1.04 (H) 0.44 - 1.00 mg/dL Final   Creat  Date Value Ref Range Status  11/23/2022 1.02 (H) 0.50 - 0.99 mg/dL Final         Failed - Last BP in normal range    BP Readings from Last 1 Encounters:  08/23/23 (!) 142/88         Passed - Na in normal range and within 180 days    Sodium  Date Value Ref Range Status  08/23/2023 137 135 - 145 mmol/L Final         Passed - eGFR is 30 or above and within 180 days    GFR, Estimated  Date Value Ref Range Status  08/23/2023 >60 >60 mL/min Final    Comment:    (NOTE) Calculated using the CKD-EPI Creatinine Equation (2021)    eGFR  Date Value Ref Range Status  11/23/2022 68 > OR = 60 mL/min/1.36m2 Final         Passed - Patient is not pregnant      Passed - Valid encounter within last 6 months    Recent Outpatient Visits           3 months ago Pure hypercholesterolemia   [facility] [place], Angeline ORN, NP   11 months ago Encounter for general adult medical examination with abnormal findings   [place] [facility] [place], Angeline ORN, NP   1 year ago Primary hypertension   [place] [facility] Delles, Sharyle LABOR, RPH-CPP   1 year ago External hemorrhoid   [place] [facility] [place], Angeline ORN, NP   1 year ago Primary hypertension   [place] [facility] Walsh, Laura E, [place]                lovastatin  (MEVACOR ) 10 MG tablet 30 tablet 2    Sig: Take 1 tablet (10 mg total) by mouth at bedtime.     Cardiovascular:  Antilipid - Statins 2 Failed - 10/20/2023 11:35 AM      Failed - Cr in normal range and within 360 days    Creatinine  Date Value Ref Range Status  08/23/2023 1.04 (H) 0.44 - 1.00 mg/dL Final   Creat  Date Value Ref Range Status  11/23/2022 1.02 (H) 0.50 - 0.99 mg/dL Final         Failed - Lipid Panel in normal range within the last 12 months    Cholesterol  Date Value Ref Range Status  06/28/2023 206 (H) <200 mg/dL Final   LDL Cholesterol (Calc)  Date Value Ref Range Status  06/28/2023 129 (H) mg/dL (calc) Final    Comment:    Reference range: <100 . Desirable range <100 mg/dL for primary prevention;   <70 mg/dL for patients  with CHD or diabetic patients  with > or = 2 CHD risk factors. SABRA LDL-C is now calculated using the Martin-Hopkins  calculation, which is a validated novel method providing  better accuracy than the Friedewald equation in the  estimation of LDL-C.  Gladis APPLETHWAITE et al. SANDREA. 7986;689(80): 2061-2068  (http://education.[facility].com/faq/FAQ164)    HDL  Date Value Ref Range Status  06/28/2023 55 > OR = 50 mg/dL Final   Triglycerides  Date Value Ref Range Status  06/28/2023 114 <150 mg/dL Final         Passed - Patient is not pregnant      Passed - Valid encounter within last 12 months    Recent Outpatient Visits           3 months ago Pure hypercholesterolemia   [place] [place], Angeline ORN, NP   11 months ago Encounter for general adult medical examination with abnormal findings   [place] [facility] [place], Angeline ORN, NP   1 year ago Primary hypertension   [place] [facility] Delles, Sharyle LABOR, RPH-CPP   1 year ago External hemorrhoid   [place] [facility] [place], Angeline ORN, NP   1 year ago Primary hypertension    [place] [facility] Hope Leita BRAVO, CMA

## 2023-11-03 ENCOUNTER — Ambulatory Visit: Payer: BC Managed Care – PPO | Admitting: Internal Medicine

## 2023-12-02 IMAGING — US US EXTREM LOW VENOUS*L*
1 series · 13 of 24 positions shown · non-contrast
Comparison: None.

CLINICAL DATA: History of prior DVT



[Series 1: us venous img lower uni left (dvt) · portal-venous · 13 of 37 slices shown]
[im 1/37]
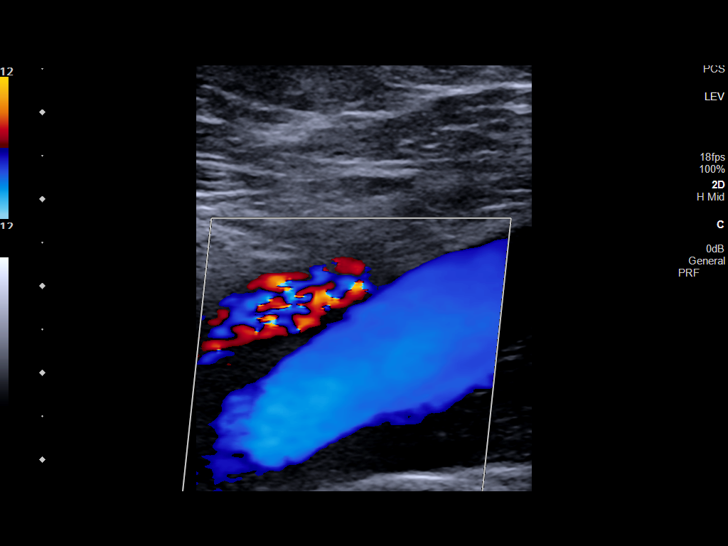
[im 4/37]
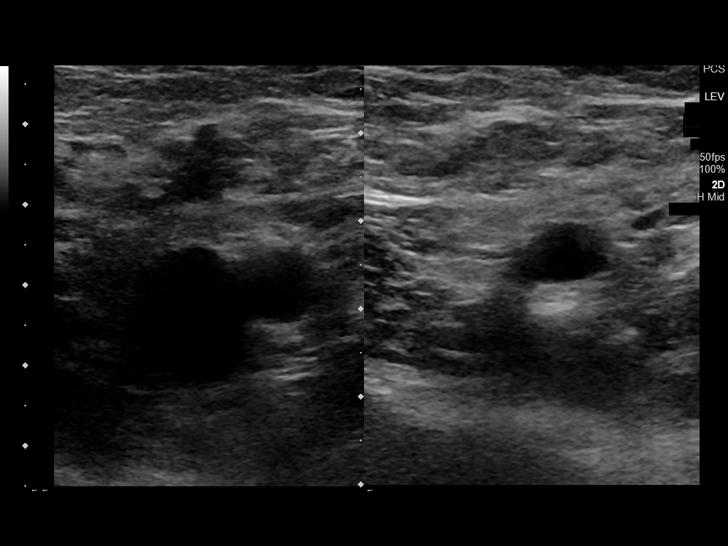
[im 7/37]
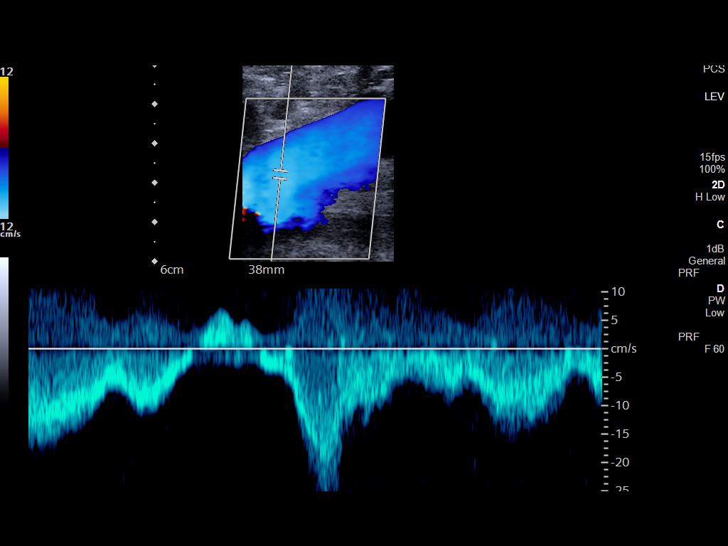
[im 10/37]
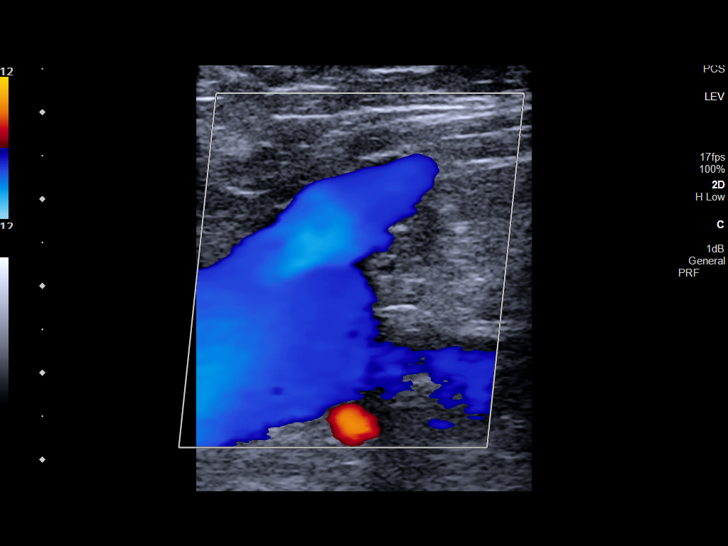
[im 13/37]
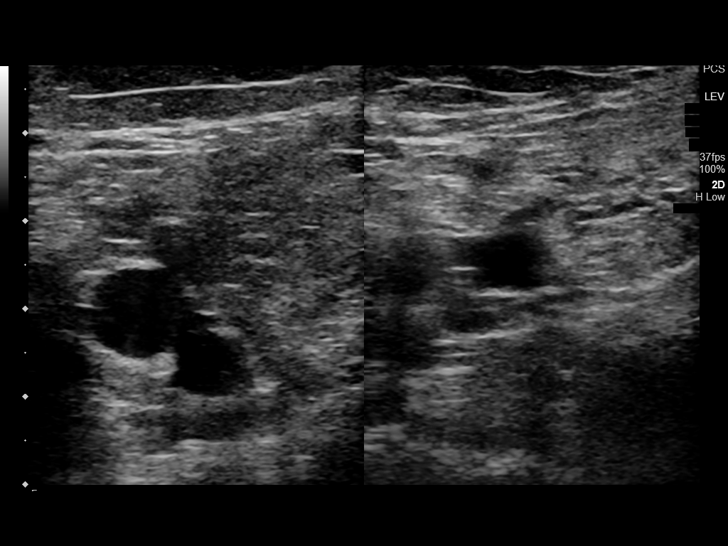
[im 16/37]
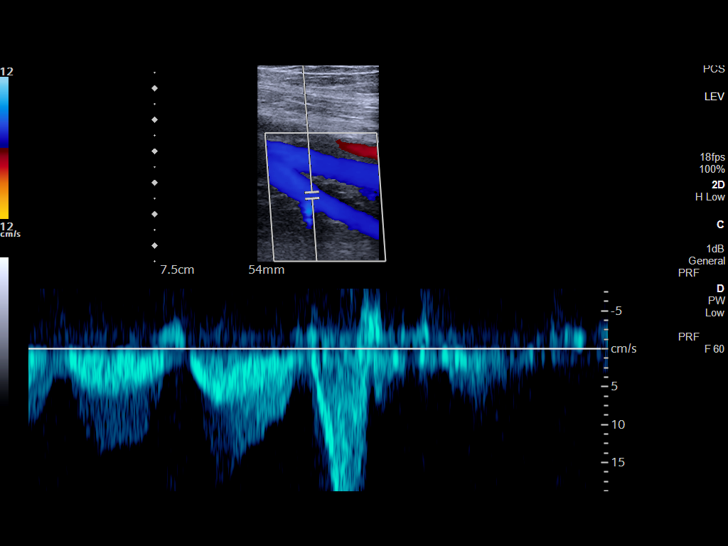
[im 19/37]
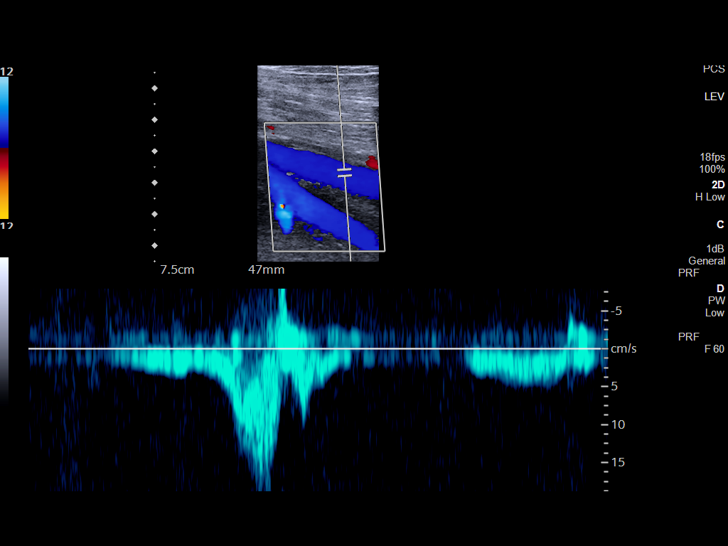
[im 21/37]
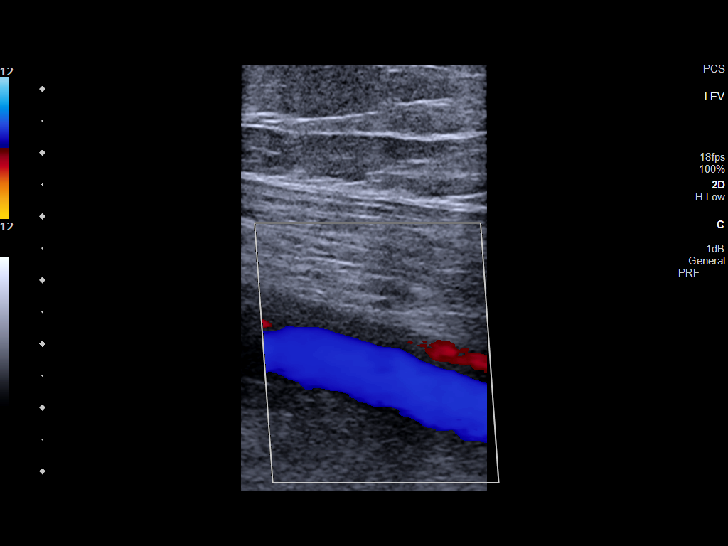
[im 24/37]
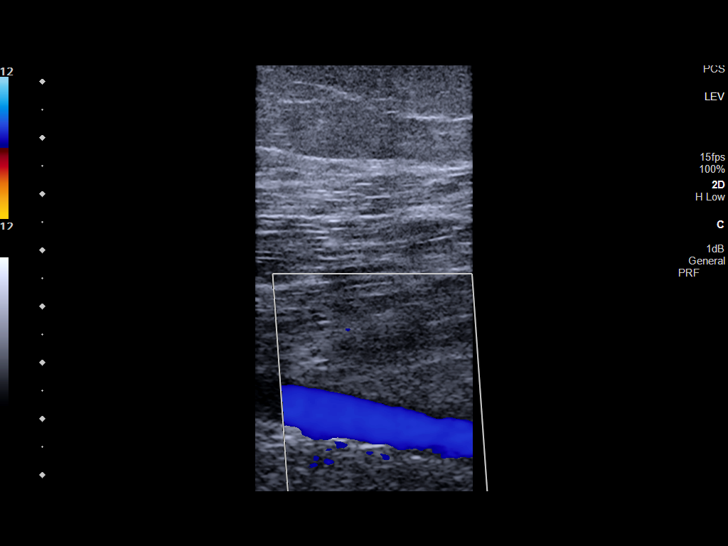
[im 27/37]
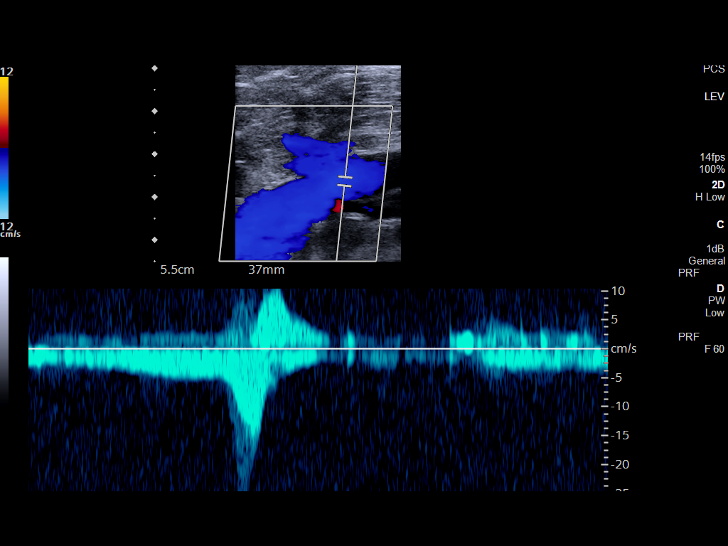
[im 30/37]
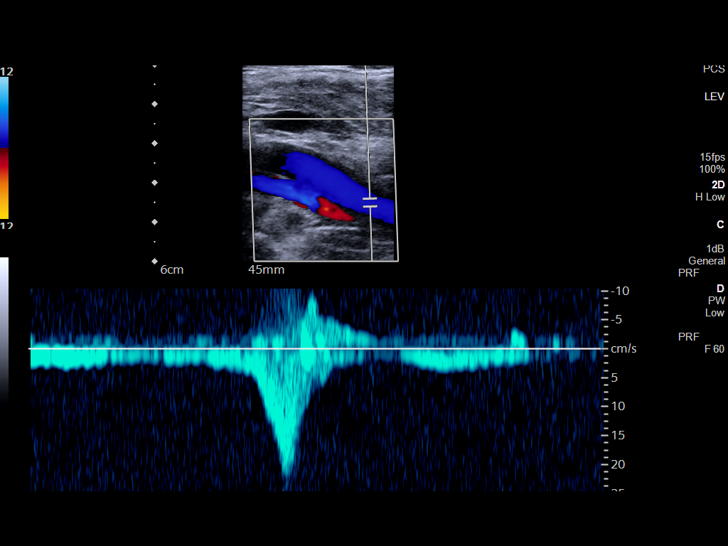
[im 33/37]
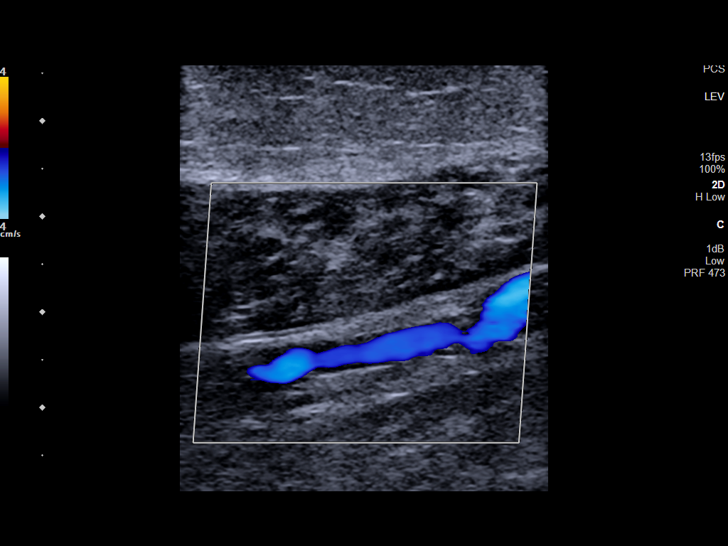
[im 37/37]
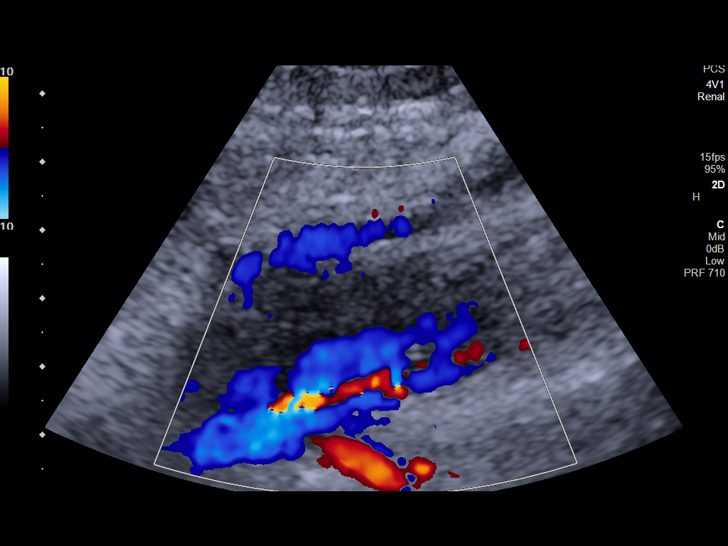

[13 of 24 positions shown; findings below may reference images not displayed]

FINDINGS: Contralateral Common Femoral Vein: Respiratory phasicity is normal
and symmetric with the symptomatic side. No evidence of thrombus.
Normal compressibility.

Common Femoral Vein: No evidence of thrombus. Normal
compressibility, respiratory phasicity and response to augmentation.

Saphenofemoral Junction: No evidence of thrombus. Normal
compressibility and flow on color Doppler imaging.

Profunda Femoral Vein: No evidence of thrombus. Normal
compressibility and flow on color Doppler imaging.

Femoral Vein: No evidence of thrombus. Normal compressibility,
respiratory phasicity and response to augmentation.

Popliteal Vein: No evidence of thrombus. Normal compressibility,
respiratory phasicity and response to augmentation.

Calf Veins: No evidence of thrombus. Normal compressibility and flow
on color Doppler imaging.
IMPRESSION: No evidence of deep venous thrombosis.

## 2023-12-06 NOTE — Progress Notes (Unsigned)
 MRN : [id number]  Stacey Townsend is a 48 y.o. (08-31-1976) female who presents with chief complaint of legs hurt and swell.  History of Present Illness:   The patient presents to the office for evaluation of DVT.  DVT was identified at [facility] by Duplex ultrasound dated ***.    The initial symptoms were pain and swelling in the lower extremity.  The patient notes the affected leg is painful with dependency and swells.  Symptoms are much better with elevation.  The patient notes minimal edema in the morning which steadily worsens throughout the day.    The patient has not been using compression therapy at this point.  No SOB or pleuritic chest pains.  No cough or hemoptysis.  No blood per rectum or blood in any sputum.  No excessive bruising per the patient.   No recent shortening of the patient's walking distance or new symptoms consistent with claudication.  No history of rest pain symptoms. No new ulcers or wounds of the lower extremities have occurred.  The patient denies amaurosis fugax or recent TIA symptoms. There are no recent neurological changes noted. No recent episodes of angina or shortness of breath documented.   No outpatient medications have been marked as taking for the 12/07/23 encounter (Appointment) with Gilda Crease, Latina Craver, MD.    Past Medical History:  Diagnosis Date   Asthma    Family history of clotting disorder    History of DVT (deep vein thrombosis)    Hypertension    Osteoarthritis, knee    Supraventricular tachycardia, paroxysmal (HCC) 2017    Past Surgical History:  Procedure Laterality Date   COLONOSCOPY WITH PROPOFOL N/A 11/04/2021   Procedure: COLONOSCOPY WITH PROPOFOL;  Surgeon: Wyline Mood, MD;  Location: [facility] ENDOSCOPY;  Service: Gastroenterology;  Laterality: N/A;   DILATION AND CURETTAGE OF UTERUS  2013   endometerial ablation  2016   HYSTEROSCOPY  2013    Social History Social History   Tobacco Use   Smoking status:  Never   Smokeless tobacco: Never  Vaping Use   Vaping status: Former   Devices: only 2-3 months  Substance Use Topics   Alcohol use: Yes    Alcohol/week: 7.0 standard drinks of alcohol    Types: 7 Standard drinks or equivalent per week   Drug use: Never    Family History Family History  Problem Relation Age of Onset   Deep vein thrombosis Mother    Thyroid disease Mother    Hypertension Father    Diabetes Father    Deep vein thrombosis Sister    Thyroid disease Sister    Hypertension Sister    Healthy Sister    Deep vein thrombosis Maternal Uncle    Deep vein thrombosis Paternal Aunt    Breast cancer Paternal Aunt 40       gene+? (half aunt)   Breast cancer Paternal Aunt 7       half aunt   Deep vein thrombosis Paternal Uncle    Breast cancer Paternal Grandmother 47    Allergies  Allergen Reactions   Penicillins Shortness Of Breath    Hives and vomitting     REVIEW OF SYSTEMS (Negative unless checked)  Constitutional: [] Weight loss  [] Fever  [] Chills Cardiac: [] Chest pain   [] Chest pressure   [] Palpitations   [] Shortness of breath when laying flat   [] Shortness of breath with exertion. Vascular:  [] Pain in legs with walking   [x] Pain in legs  at rest  [] History of DVT   [] Phlebitis   [x] Swelling in legs   [] Varicose veins   [] Non-healing ulcers Pulmonary:   [] Uses home oxygen   [] Productive cough   [] Hemoptysis   [] Wheeze  [] COPD   [] Asthma Neurologic:  [] Dizziness   [] Seizures   [] History of stroke   [] History of TIA  [] Aphasia   [] Vissual changes   [] Weakness or numbness in arm   [x] Weakness or numbness in leg Musculoskeletal:   [] Joint swelling   [x] Joint pain   [x] Low back pain Hematologic:  [] Easy bruising  [] Easy bleeding   [] Hypercoagulable state   [] Anemic Gastrointestinal:  [] Diarrhea   [] Vomiting  [] Gastroesophageal reflux/heartburn   [] Difficulty swallowing. Genitourinary:  [] Chronic kidney disease   [] Difficult urination  [] Frequent urination   [] Blood in  urine Skin:  [] Rashes   [] Ulcers  Psychological:  [] History of anxiety   []  History of major depression.  Physical Examination  There were no vitals filed for this visit. There is no height or weight on file to calculate BMI. Gen: WD/WN, NAD Head: [place]/AT, No temporalis wasting.  Ear/Nose/Throat: Hearing grossly intact, nares w/o erythema or drainage, pinna without lesions Eyes: PER, EOMI, sclera nonicteric.  Neck: Supple, no gross masses.  No JVD.  Pulmonary:  Good air movement, no audible wheezing, no use of accessory muscles.  Cardiac: RRR, precordium not hyperdynamic. Vascular:  scattered varicosities present bilaterally.  Moderate venous stasis changes to the legs bilaterally.  2+ soft pitting edema. CEAP [id number]   Vessel Right Left  Radial Palpable Palpable  Gastrointestinal: soft, non-distended. No guarding/no peritoneal signs.  Musculoskeletal: M/S 5/5 throughout.  No deformity.  Neurologic: CN 2-12 intact. Pain and light touch intact in extremities.  Symmetrical.  Speech is fluent. Motor exam as listed above. Psychiatric: Judgment intact, Mood & affect appropriate for pt's clinical situation. Dermatologic: Venous rashes no ulcers noted.  No changes consistent with cellulitis. Lymph : No lichenification or skin changes of chronic lymphedema.  CBC Lab Results  Component Value Date   WBC 7.5 08/23/2023   HGB 13.2 08/23/2023   HCT 41.1 08/23/2023   MCV 78.3 (L) 08/23/2023   PLT 237 08/23/2023    BMET    Component Value Date/Time   NA 137 08/23/2023 1028   K 3.4 (L) 08/23/2023 1028   CL 99 08/23/2023 1028   CO2 29 08/23/2023 1028   GLUCOSE 115 (H) 08/23/2023 1028   BUN 16 08/23/2023 1028   CREATININE 1.04 (H) 08/23/2023 1028   CREATININE 1.02 (H) 11/23/2022 1058   CALCIUM 8.9 08/23/2023 1028   GFRNONAA >60 08/23/2023 1028   CrCl cannot be calculated (Patient's most recent lab result is older than the maximum 21 days allowed.).  COAG No results found for: "INR",  "PROTIME"  Radiology No results found.   Assessment/Plan There are no diagnoses linked to this encounter.   Levora Dredge, MD  12/06/2023 8:31 PM

## 2023-12-07 ENCOUNTER — Ambulatory Visit (INDEPENDENT_AMBULATORY_CARE_PROVIDER_SITE_OTHER): Payer: Self-pay | Admitting: Vascular Surgery

## 2023-12-07 ENCOUNTER — Encounter (INDEPENDENT_AMBULATORY_CARE_PROVIDER_SITE_OTHER): Payer: Self-pay | Admitting: Vascular Surgery

## 2023-12-07 VITALS — BP 173/104 | HR 81 | Resp 18 | Ht 68.0 in | Wt 279.0 lb

## 2023-12-07 DIAGNOSIS — I10 Essential (primary) hypertension: Secondary | ICD-10-CM | POA: Diagnosis not present

## 2023-12-07 DIAGNOSIS — E7800 Pure hypercholesterolemia, unspecified: Secondary | ICD-10-CM

## 2023-12-07 DIAGNOSIS — Z86718 Personal history of other venous thrombosis and embolism: Secondary | ICD-10-CM

## 2023-12-07 DIAGNOSIS — M79605 Pain in left leg: Secondary | ICD-10-CM

## 2023-12-07 DIAGNOSIS — I471 Supraventricular tachycardia, unspecified: Secondary | ICD-10-CM

## 2023-12-07 DIAGNOSIS — M79604 Pain in right leg: Secondary | ICD-10-CM | POA: Diagnosis not present

## 2023-12-08 ENCOUNTER — Encounter (INDEPENDENT_AMBULATORY_CARE_PROVIDER_SITE_OTHER): Payer: Self-pay | Admitting: Vascular Surgery

## 2023-12-08 DIAGNOSIS — M79606 Pain in leg, unspecified: Secondary | ICD-10-CM | POA: Insufficient documentation

## 2024-01-30 ENCOUNTER — Other Ambulatory Visit (INDEPENDENT_AMBULATORY_CARE_PROVIDER_SITE_OTHER): Payer: Self-pay | Admitting: Vascular Surgery

## 2024-01-30 ENCOUNTER — Encounter (INDEPENDENT_AMBULATORY_CARE_PROVIDER_SITE_OTHER): Payer: Self-pay

## 2024-01-30 DIAGNOSIS — M79661 Pain in right lower leg: Secondary | ICD-10-CM

## 2024-01-30 DIAGNOSIS — Z86718 Personal history of other venous thrombosis and embolism: Secondary | ICD-10-CM

## 2024-02-01 ENCOUNTER — Ambulatory Visit (INDEPENDENT_AMBULATORY_CARE_PROVIDER_SITE_OTHER)

## 2024-02-01 ENCOUNTER — Encounter (INDEPENDENT_AMBULATORY_CARE_PROVIDER_SITE_OTHER): Payer: Self-pay | Admitting: Vascular Surgery

## 2024-02-01 ENCOUNTER — Ambulatory Visit (INDEPENDENT_AMBULATORY_CARE_PROVIDER_SITE_OTHER): Admitting: Vascular Surgery

## 2024-02-01 VITALS — BP 154/88 | HR 75 | Wt 276.0 lb

## 2024-02-01 DIAGNOSIS — Z86718 Personal history of other venous thrombosis and embolism: Secondary | ICD-10-CM | POA: Diagnosis not present

## 2024-02-01 DIAGNOSIS — I10 Essential (primary) hypertension: Secondary | ICD-10-CM

## 2024-02-01 DIAGNOSIS — M79605 Pain in left leg: Secondary | ICD-10-CM

## 2024-02-01 DIAGNOSIS — M79662 Pain in left lower leg: Secondary | ICD-10-CM | POA: Diagnosis not present

## 2024-02-01 DIAGNOSIS — M79604 Pain in right leg: Secondary | ICD-10-CM

## 2024-02-01 DIAGNOSIS — M79661 Pain in right lower leg: Secondary | ICD-10-CM

## 2024-02-01 DIAGNOSIS — E7800 Pure hypercholesterolemia, unspecified: Secondary | ICD-10-CM | POA: Diagnosis not present

## 2024-02-01 DIAGNOSIS — J4540 Moderate persistent asthma, uncomplicated: Secondary | ICD-10-CM

## 2024-02-05 ENCOUNTER — Encounter (INDEPENDENT_AMBULATORY_CARE_PROVIDER_SITE_OTHER): Payer: Self-pay | Admitting: Vascular Surgery

## 2024-02-05 NOTE — Progress Notes (Signed)
 MRN : [id number]  Stacey Townsend is a 48 y.o. (02-12-1976) female who presents with chief complaint of check circulation.  History of Present Illness:   The patient is seen for evaluation of painful lower extremities.  The pain is mostly down the right leg radiating from the lateral hip area to the below the knee region.  Patient notes the pain is variable and not always associated with activity.  The pain is somewhat consistent day to day occurring on most days. The patient notes the pain also occurs with standing or sitting for long periods.  The extremity pain routinely seems worse as the day wears on. The pain has been progressive over the past several years. The patient states these symptoms are causing a negative impact on quality of life and daily activities which was a factor in the evaluation.  The patient has a history of back problems and DJD of the lumbar and sacral spine.   The patient denies rest pain or dangling of an extremity off the side of the bed during the night for relief. No open wounds or sores at this time. No history of DVT or phlebitis. No prior vascular interventions or surgeries.  Duplex ultrasound bilateral lower extremity venous system is essentially normal.  No reflux is noted no evidence of changes consistent with chronic deep venous thrombosis or significant reflux   Current Meds  Medication Sig   albuterol  (VENTOLIN  HFA) 108 (90 Base) MCG/ACT inhaler Inhale 2 puffs into the lungs every 4 (four) hours as needed for wheezing or shortness of breath.   apixaban  (ELIQUIS ) 2.5 MG TABS tablet Take 1 tablet (2.5 mg total) by mouth 2 (two) times daily.   baclofen  (LIORESAL ) 10 MG tablet Take 1 tablet (10 mg total) by mouth daily as needed for muscle spasms.   betamethasone dipropionate 0.05 % lotion Apply topically 2 (two) times daily.   ciclesonide (ALVESCO) 80 MCG/ACT inhaler Inhale 1  puff into the lungs 2 (two) times daily.   ketoconazole (NIZORAL) 2 % shampoo Apply 1 Application topically once.   lisinopril -hydrochlorothiazide  (ZESTORETIC ) 20-25 MG tablet TAKE ONE TABLET BY MOUTH ONE TIME DAILY   lovastatin  (MEVACOR ) 10 MG tablet TAKE ONE TABLET BY MOUTH AT BEDTIME   tacrolimus (PROTOPIC) 0.1 % ointment Apply 1 Application topically 2 (two) times daily.   triamcinolone (KENALOG) 0.025 % cream Apply 1 Application topically 2 (two) times daily.    Past Medical History:  Diagnosis Date   Asthma    Family history of clotting disorder    History of DVT (deep vein thrombosis)    Hypertension    Osteoarthritis, knee    Supraventricular tachycardia, paroxysmal (HCC) 2017    Past Surgical History:  Procedure Laterality Date   COLONOSCOPY WITH PROPOFOL  N/A 11/04/2021   Procedure: COLONOSCOPY WITH PROPOFOL ;  Surgeon: Luke Salaam, MD;  Location: [facility] ENDOSCOPY;  Service: Gastroenterology;  Laterality: N/A;   DILATION AND CURETTAGE OF UTERUS  2013   endometerial ablation  2016   HYSTEROSCOPY  2013    Social History Social History   Tobacco Use   Smoking status:  Never   Smokeless tobacco: Never  Vaping Use   Vaping status: Former   Devices: only 2-3 months  Substance Use Topics   Alcohol use: Yes    Alcohol/week: 7.0 standard drinks of alcohol    Types: 7 Standard drinks or equivalent per week   Drug use: Never    Family History Family History  Problem Relation Age of Onset   Deep vein thrombosis Mother    Thyroid disease Mother    Hypertension Father    Diabetes Father    Deep vein thrombosis Sister    Thyroid disease Sister    Hypertension Sister    Healthy Sister    Deep vein thrombosis Maternal Uncle    Deep vein thrombosis Paternal Aunt    Breast cancer Paternal Aunt 40       gene+? (half aunt)   Breast cancer Paternal Aunt 47       half aunt   Deep vein thrombosis Paternal Uncle    Breast cancer Paternal Grandmother 40    Allergies   Allergen Reactions   Penicillins Shortness Of Breath    Hives and vomitting     REVIEW OF SYSTEMS (Negative unless checked)  Constitutional: [] Weight loss  [] Fever  [] Chills Cardiac: [] Chest pain   [] Chest pressure   [] Palpitations   [] Shortness of breath when laying flat   [] Shortness of breath with exertion. Vascular:  [x] Pain in legs with walking   [] Pain in legs at rest  [] History of DVT   [] Phlebitis   [] Swelling in legs   [] Varicose veins   [] Non-healing ulcers Pulmonary:   [] Uses home oxygen   [] Productive cough   [] Hemoptysis   [] Wheeze  [] COPD   [] Asthma Neurologic:  [] Dizziness   [] Seizures   [] History of stroke   [] History of TIA  [] Aphasia   [] Vissual changes   [] Weakness or numbness in arm   [] Weakness or numbness in leg Musculoskeletal:   [] Joint swelling   [] Joint pain   [] Low back pain Hematologic:  [] Easy bruising  [] Easy bleeding   [] Hypercoagulable state   [] Anemic Gastrointestinal:  [] Diarrhea   [] Vomiting  [] Gastroesophageal reflux/heartburn   [] Difficulty swallowing. Genitourinary:  [] Chronic kidney disease   [] Difficult urination  [] Frequent urination   [] Blood in urine Skin:  [] Rashes   [] Ulcers  Psychological:  [] History of anxiety   []  History of major depression.  Physical Examination  Vitals:   02/01/24 1431  BP: (!) 154/88  Pulse: 75  Weight: 276 lb (125.2 kg)   Body mass index is 41.97 kg/m. Gen: WD/WN, NAD Head: [place]/AT, No temporalis wasting.  Ear/Nose/Throat: Hearing grossly intact, nares w/o erythema or drainage Eyes: PER, EOMI, sclera nonicteric.  Neck: Supple, no masses.  No bruit or JVD.  Pulmonary:  Good air movement, no audible wheezing, no use of accessory muscles.  Cardiac: RRR, normal S1, S2, no Murmurs. Vascular:  mild trophic changes, no open wounds Vessel Right Left  Radial Palpable Palpable  PT Palpable Palpable  DP Palpable Palpable  Gastrointestinal: soft, non-distended. No guarding/no peritoneal signs.  Musculoskeletal: M/S  5/5 throughout.  No visible deformity.  Neurologic: CN 2-12 intact. Pain and light touch intact in extremities.  Symmetrical.  Speech is fluent. Motor exam as listed above. Psychiatric: Judgment intact, Mood & affect appropriate for pt's clinical situation. Dermatologic: No rashes or ulcers noted.  No changes consistent with cellulitis.   CBC Lab Results  Component Value Date   WBC 7.5 08/23/2023   HGB 13.2 08/23/2023   HCT  41.1 08/23/2023   MCV 78.3 (L) 08/23/2023   PLT 237 08/23/2023    BMET    Component Value Date/Time   NA 137 08/23/2023 1028   K 3.4 (L) 08/23/2023 1028   CL 99 08/23/2023 1028   CO2 29 08/23/2023 1028   GLUCOSE 115 (H) 08/23/2023 1028   BUN 16 08/23/2023 1028   CREATININE 1.04 (H) 08/23/2023 1028   CREATININE 1.02 (H) 11/23/2022 1058   CALCIUM 8.9 08/23/2023 1028   GFRNONAA >60 08/23/2023 1028   CrCl cannot be calculated (Patient's most recent lab result is older than the maximum 21 days allowed.).  COAG No results found for: "INR", "PROTIME"  Radiology No results found.   Assessment/Plan 1. Pain in both lower extremities (Primary) Recommend:  The patient has atypical pain symptoms for vascular disease and on exam I do not find evidence of vascular pathology that would explain the patient's symptoms.  Noninvasive studies do not identify significant vascular problems  I suspect the patient is c/o pseudoclaudication.  Patient should have an evaluation of the LS spine which I defer to the Spine service.  The patient should continue walking and begin a more formal exercise program. The patient should continue his antiplatelet therapy and aggressive treatment of the lipid abnormalities.  Patient will follow-up with me on a PRN basis. - Ambulatory referral to Neurosurgery  2. Pure hypercholesterolemia Continue statin as ordered and reviewed, no changes at this time  3. Moderate persistent asthma without complication Continue pulmonary  medications and aerosols as already ordered, these medications have been reviewed and there are no changes at this time.   4. Primary hypertension Continue antihypertensive medications as already ordered, these medications have been reviewed and there are no changes at this time.    Devon Fogo, MD  02/05/2024 12:23 PM

## 2024-02-08 ENCOUNTER — Other Ambulatory Visit: Payer: Self-pay | Admitting: Internal Medicine

## 2024-02-09 NOTE — Telephone Encounter (Signed)
Called pt to schedule appt. Pt will call back to schedule appt.

## 2024-02-09 NOTE — Telephone Encounter (Signed)
 Courtesy refill. Patient will need an office visit for additional refills. Requested Prescriptions  Pending Prescriptions Disp Refills   lisinopril -hydrochlorothiazide  (ZESTORETIC ) 20-25 MG tablet [Pharmacy Med Name: LISINOPRIL -HCTZ 20-25 MG TAB[*]] 30 tablet 0    Sig: TAKE ONE TABLET BY MOUTH ONE TIME DAILY     Cardiovascular:  ACEI + Diuretic Combos Failed - 02/09/2024  4:54 PM      Failed - K in normal range and within 180 days    Potassium  Date Value Ref Range Status  08/23/2023 3.4 (L) 3.5 - 5.1 mmol/L Final         Failed - Cr in normal range and within 180 days    Creatinine  Date Value Ref Range Status  08/23/2023 1.04 (H) 0.44 - 1.00 mg/dL Final   Creat  Date Value Ref Range Status  11/23/2022 1.02 (H) 0.50 - 0.99 mg/dL Final         Failed - Last BP in normal range    BP Readings from Last 1 Encounters:  02/01/24 (!) 154/88         Failed - Valid encounter within last 6 months    Recent Outpatient Visits   None            Passed - Na in normal range and within 180 days    Sodium  Date Value Ref Range Status  08/23/2023 137 135 - 145 mmol/L Final         Passed - eGFR is 30 or above and within 180 days    GFR, Estimated  Date Value Ref Range Status  08/23/2023 >60 >60 mL/min Final    Comment:    (NOTE) Calculated using the CKD-EPI Creatinine Equation (2021)    eGFR  Date Value Ref Range Status  11/23/2022 68 > OR = 60 mL/min/1.89m2 Final         Passed - Patient is not pregnant

## 2024-02-21 ENCOUNTER — Inpatient Hospital Stay: Payer: BC Managed Care – PPO | Attending: Oncology

## 2024-02-21 ENCOUNTER — Inpatient Hospital Stay (HOSPITAL_BASED_OUTPATIENT_CLINIC_OR_DEPARTMENT_OTHER): Payer: BC Managed Care – PPO | Admitting: Oncology

## 2024-02-21 ENCOUNTER — Encounter: Payer: Self-pay | Admitting: Oncology

## 2024-02-21 VITALS — BP 146/90 | HR 81 | Temp 97.7°F | Resp 18 | Ht 68.0 in | Wt 281.0 lb

## 2024-02-21 DIAGNOSIS — Z7901 Long term (current) use of anticoagulants: Secondary | ICD-10-CM | POA: Insufficient documentation

## 2024-02-21 DIAGNOSIS — Z86718 Personal history of other venous thrombosis and embolism: Secondary | ICD-10-CM | POA: Insufficient documentation

## 2024-02-21 LAB — CMP (CANCER CENTER ONLY)
ALT: 21 U/L (ref 0–44)
AST: 17 U/L (ref 15–41)
Albumin: 3.8 g/dL (ref 3.5–5.0)
Alkaline Phosphatase: 50 U/L (ref 38–126)
Anion gap: 6 (ref 5–15)
BUN: 16 mg/dL (ref 6–20)
CO2: 29 mmol/L (ref 22–32)
Calcium: 8.8 mg/dL — ABNORMAL LOW (ref 8.9–10.3)
Chloride: 102 mmol/L (ref 98–111)
Creatinine: 0.88 mg/dL (ref 0.44–1.00)
GFR, Estimated: >60 mL/min (ref >60)
Glucose, Bld: 107 mg/dL — ABNORMAL HIGH (ref 70–99)
Potassium: 4.1 mmol/L (ref 3.5–5.1)
Sodium: 137 mmol/L (ref 135–145)
Total Bilirubin: 0.6 mg/dL (ref 0.0–1.2)
Total Protein: 6.9 g/dL (ref 6.5–8.1)

## 2024-02-21 LAB — CBC WITH DIFFERENTIAL (CANCER CENTER ONLY)
Abs Immature Granulocytes: 0.02 10*3/uL (ref 0.00–0.07)
Basophils Absolute: 0.1 10*3/uL (ref 0.0–0.1)
Basophils Relative: 1 %
Eosinophils Absolute: 0.2 10*3/uL (ref 0.0–0.5)
Eosinophils Relative: 3 %
HCT: 38.9 % (ref 36.0–46.0)
Hemoglobin: 12.4 g/dL (ref 12.0–15.0)
Immature Granulocytes: 0 %
Lymphocytes Relative: 32 %
Lymphs Abs: 2 10*3/uL (ref 0.7–4.0)
MCH: 25.3 pg — ABNORMAL LOW (ref 26.0–34.0)
MCHC: 31.9 g/dL (ref 30.0–36.0)
MCV: 79.4 fL — ABNORMAL LOW (ref 80.0–100.0)
Monocytes Absolute: 0.4 10*3/uL (ref 0.1–1.0)
Monocytes Relative: 6 %
Neutro Abs: 3.6 10*3/uL (ref 1.7–7.7)
Neutrophils Relative %: 58 %
Platelet Count: 220 10*3/uL (ref 150–400)
RBC: 4.9 MIL/uL (ref 3.87–5.11)
RDW: 14.9 % (ref 11.5–15.5)
WBC Count: 6.2 10*3/uL (ref 4.0–10.5)
nRBC: 0 % (ref 0.0–0.2)

## 2024-02-21 MED ORDER — APIXABAN 2.5 MG PO TABS: 2.5000 mg | ORAL_TABLET | Freq: Two times a day (BID) | ORAL | 1 refills | Status: DC

## 2024-02-21 NOTE — Progress Notes (Signed)
 Patient has been doing well. She needs a refill on her Eliquis  medication. She needs a OBGYN, she hasn't been to one on 2 years.

## 2024-02-21 NOTE — Assessment & Plan Note (Addendum)
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 Negative anticardiolipin IgG/IgM.  10/07/2021 positive lupus anticoagulant 02/04/2022, repeat testing showed negative lupus anticoagulant.  Not meeting APS diagnosis criteria June 2023-present time she is on Eliquis  2.5mg  BID for long-term anticoagulation prophylaxis.  Jan 2025 repeat US  LE, no DVT Continue current Eliquis  2.5mg  BID as prophylaxis

## 2024-02-21 NOTE — Progress Notes (Signed)
 Hematology/Oncology Progress note [phone] [phone]         Patient Care Team: Carollynn Cirri, NP as PCP - General (Internal Medicine) Timmy Forbes, MD as Consulting Physician (Hematology and Oncology)  ASSESSMENT & PLAN:   History of DVT (deep vein thrombosis) #Unprovoked DVT March 2022- June 2023 -Pradaxa   Hypercoagulable work up negative: negative Factor 5 leiden mutation, negative prothrombin gene mutation. Negative anticardiolipin IgG/IgM.  10/07/2021 positive lupus anticoagulant 02/04/2022, repeat testing showed negative lupus anticoagulant.  Not meeting APS diagnosis criteria June 2023-present time she is on Eliquis  2.5mg  BID for long-term anticoagulation prophylaxis.  Jan 2025 repeat US  LE, no DVT Continue current Eliquis  2.5mg  BID as prophylaxis  She will discuss with her PCP about Gyn referral.  Orders Placed This Encounter  Procedures   CBC with Differential (Cancer Center Only)    Standing Status:   Future    Expected Date:   08/22/2024    Expiration Date:   02/20/2025   CMP (Cancer Center only)    Standing Status:   Future    Expected Date:   08/22/2024    Expiration Date:   02/20/2025   Follow up in 6 months.  All questions were answered. The patient knows to call the clinic with any problems, questions or concerns.  Timmy Forbes, MD, PhD [facility] Hematology Oncology 02/21/2024   CHIEF COMPLAINTS/REASON FOR VISIT:   history of DVT  HISTORY OF PRESENTING ILLNESS:   Stacey Townsend is a  48 y.o.  female with PMH listed below was seen in consultation at the request of  Carollynn Cirri, NP  for evaluation of history of DVT  12/02/2020, patient presented to urgent care for evaluation of left lower extremity pain.  Was diagnosed with isolated calf DVT involving one of the posterior tibial and peroneal veins.  Patient was started on Lovenox bridged Pradaxa  150 mg twice daily.  Patient tolerates anticoagulation.  There was no  immobilization factors prior to her symptom presentation. She had COVID 19 infection in January 2022.  Patient moved to [place]  from [place]  in July 2022.  She has establish care with primary care provider and was referred to establish care with hematology for management of history of DVT and anticoagulation.  Patient reports significant family history of blood clots, mother, sister, uncle. Patient denies smoking. Family history positive for breast cancer, paternal aunt and maternal grandmother.  INTERVAL HISTORY Stacey Townsend is a 48 y.o. female who has above history reviewed by me today presents for follow up visit for management of history of DVT Patient is currently taking Eliquis  2.5mg  BID,  No active bleeding event.  No new complaints.   Review of Systems  Constitutional:  Negative for appetite change, chills, fatigue and fever.  HENT:   Negative for hearing loss and voice change.   Eyes:  Negative for eye problems.  Respiratory:  Negative for chest tightness and cough.   Cardiovascular:  Positive for leg swelling. Negative for chest pain.  Gastrointestinal:  Negative for abdominal distention, abdominal pain and blood in stool.  Endocrine: Negative for hot flashes.  Genitourinary:  Negative for difficulty urinating and frequency.   Musculoskeletal:  Negative for arthralgias.  Skin:  Negative for itching and rash.  Neurological:  Negative for extremity weakness.  Hematological:  Negative for adenopathy.  Psychiatric/Behavioral:  Negative for confusion.     MEDICAL HISTORY:  Past Medical History:  Diagnosis Date   Asthma    Family history of clotting  disorder    History of DVT (deep vein thrombosis)    Hypertension    Osteoarthritis, knee    Supraventricular tachycardia, paroxysmal (HCC) 2017    SURGICAL HISTORY: Past Surgical History:  Procedure Laterality Date   COLONOSCOPY WITH PROPOFOL  N/A 11/04/2021   Procedure: COLONOSCOPY WITH PROPOFOL ;  Surgeon:  Luke Salaam, MD;  Location: [facility] ENDOSCOPY;  Service: Gastroenterology;  Laterality: N/A;   DILATION AND CURETTAGE OF UTERUS  2013   endometerial ablation  2016   HYSTEROSCOPY  2013    SOCIAL HISTORY: Social History   Socioeconomic History   Marital status: Single    Spouse name: Not on file   Number of children: Not on file   Years of education: Not on file   Highest education level: Not on file  Occupational History   Not on file  Tobacco Use   Smoking status: Never   Smokeless tobacco: Never  Vaping Use   Vaping status: Former   Devices: only 2-3 months  Substance and Sexual Activity   Alcohol use: Yes    Alcohol/week: 7.0 standard drinks of alcohol    Types: 7 Standard drinks or equivalent per week   Drug use: Never   Sexual activity: Yes  Other Topics Concern   Not on file  Social History Narrative   Not on file   Social Drivers of Health   Financial Resource Strain: Not on file  Food Insecurity: Not on file  Transportation Needs: Not on file  Physical Activity: Not on file  Stress: Not on file  Social Connections: Not on file  Intimate Partner Violence: Not on file    FAMILY HISTORY: Family History  Problem Relation Age of Onset   Deep vein thrombosis Mother    Thyroid disease Mother    Hypertension Father    Diabetes Father    Deep vein thrombosis Sister    Thyroid disease Sister    Hypertension Sister    Healthy Sister    Deep vein thrombosis Maternal Uncle    Deep vein thrombosis Paternal Aunt    Breast cancer Paternal Aunt 40       gene+? (half aunt)   Breast cancer Paternal Aunt 57       half aunt   Deep vein thrombosis Paternal Uncle    Breast cancer Paternal Grandmother 48    ALLERGIES:  is allergic to penicillins.  MEDICATIONS:  Current Outpatient Medications  Medication Sig Dispense Refill   acetaminophen (TYLENOL) 500 MG tablet Take 500 mg by mouth every 8 (eight) hours as needed.     albuterol  (VENTOLIN  HFA) 108 (90 Base)  MCG/ACT inhaler Inhale 2 puffs into the lungs every 4 (four) hours as needed for wheezing or shortness of breath. 1 each 5   baclofen  (LIORESAL ) 10 MG tablet Take 1 tablet (10 mg total) by mouth daily as needed for muscle spasms. 90 tablet 1   betamethasone dipropionate 0.05 % lotion Apply topically 2 (two) times daily.     cetirizine (ZYRTEC) 10 MG tablet Take 10 mg by mouth daily as needed.     ciclesonide (ALVESCO) 80 MCG/ACT inhaler Inhale 1 puff into the lungs 2 (two) times daily.     ketoconazole (NIZORAL) 2 % shampoo Apply 1 Application topically once.     lisinopril -hydrochlorothiazide  (ZESTORETIC ) 20-25 MG tablet TAKE ONE TABLET BY MOUTH ONE TIME DAILY 30 tablet 0   lovastatin  (MEVACOR ) 10 MG tablet TAKE ONE TABLET BY MOUTH AT BEDTIME 30 tablet 2   tacrolimus (PROTOPIC)  0.1 % ointment Apply 1 Application topically 2 (two) times daily.     triamcinolone (KENALOG) 0.025 % cream Apply 1 Application topically 2 (two) times daily.     apixaban  (ELIQUIS ) 2.5 MG TABS tablet Take 1 tablet (2.5 mg total) by mouth 2 (two) times daily. 180 tablet 1   No current facility-administered medications for this visit.     PHYSICAL EXAMINATION: ECOG PERFORMANCE STATUS: 0 - Asymptomatic Vitals:   02/21/24 1036  BP: (!) 146/90  Pulse: 81  Resp: 18  Temp: 97.7 F (36.5 C)  SpO2: 100%   Filed Weights   02/21/24 1036  Weight: 281 lb (127.5 kg)    Physical Exam Constitutional:      General: She is not in acute distress. HENT:     Head: Normocephalic and atraumatic.  Eyes:     General: No scleral icterus. Cardiovascular:     Rate and Rhythm: Normal rate and regular rhythm.     Heart sounds: Normal heart sounds.  Pulmonary:     Effort: Pulmonary effort is normal. No respiratory distress.     Breath sounds: No wheezing.  Abdominal:     General: Bowel sounds are normal. There is no distension.     Palpations: Abdomen is soft.  Musculoskeletal:        General: No deformity. Normal range of  motion.     Cervical back: Normal range of motion and neck supple.     Right lower leg: No edema.     Left lower leg: No edema.  Skin:    General: Skin is warm and dry.     Findings: No erythema or rash.  Neurological:     Mental Status: She is alert and oriented to person, place, and time. Mental status is at baseline.     Cranial Nerves: No cranial nerve deficit.     Coordination: Coordination normal.  Psychiatric:        Mood and Affect: Mood normal.     LABORATORY DATA:  I have reviewed the data as listed    Latest Ref Rng & Units 02/21/2024   10:13 AM 08/23/2023   10:28 AM 02/17/2023   11:32 AM  CBC  WBC 4.0 - 10.5 K/uL 6.2  7.5  7.2   Hemoglobin 12.0 - 15.0 g/dL 84.6  96.2  95.2   Hematocrit 36.0 - 46.0 % 38.9  41.1  39.0   Platelets 150 - 400 K/uL 220  237  217       Latest Ref Rng & Units 02/21/2024   10:14 AM 08/23/2023   10:28 AM 02/17/2023   11:32 AM  CMP  Glucose 70 - 99 mg/dL 841  324  92   BUN 6 - 20 mg/dL 16  16  13    Creatinine 0.44 - 1.00 mg/dL 4.01  0.27  2.53   Sodium 135 - 145 mmol/L 137  137  139   Potassium 3.5 - 5.1 mmol/L 4.1  3.4  3.7   Chloride 98 - 111 mmol/L 102  99  104   CO2 22 - 32 mmol/L 29  29  28    Calcium 8.9 - 10.3 mg/dL 8.8  8.9  9.0   Total Protein 6.5 - 8.1 g/dL 6.9  7.3  7.1   Total Bilirubin 0.0 - 1.2 mg/dL 0.6  0.5  0.4   Alkaline Phos 38 - 126 U/L 50  52  52   AST 15 - 41 U/L 17  17  18  ALT 0 - 44 U/L 21  19  21     Iron/TIBC/Ferritin/ %Sat No results found for: IRON, TIBC, FERRITIN, IRONPCTSAT    RADIOGRAPHIC STUDIES: I have personally reviewed the radiological images as listed and agreed with the findings in the report. VAS US  LOWER EXTREMITY VENOUS REFLUX Result Date: 02/06/2024  Lower Venous Reflux Study Patient Name:  Alizee V Dinkel  Date of Exam:   02/01/2024 Medical Rec #: [id number]             Accession #:    [id number] Date of Birth: [birth year]             Patient Gender: F Patient Age:   54 years Exam  Location:  [facility] Procedure:      VAS US  LOWER EXTREMITY VENOUS REFLUX Referring Phys: Devon Fogo --------------------------------------------------------------------------------  Indications: Pain, Swelling, and Edema.  Performing Technologist: Oneta Bilberry RVT  Examination Guidelines: A complete evaluation includes B-mode imaging, spectral Doppler, color Doppler, and power Doppler as needed of all accessible portions of each vessel. Bilateral testing is considered an integral part of a complete examination. Limited examinations for reoccurring indications may be performed as noted. The reflux portion of the exam is performed with the patient in reverse Trendelenburg. Significant venous reflux is defined as >500 ms in the superficial venous system, and >1 second in the deep venous system.  Venous Reflux Times +--------------+---------+------+-----------+------------+--------+ RIGHT         Reflux NoRefluxReflux TimeDiameter cmsComments                         Yes                                  +--------------+---------+------+-----------+------------+--------+ CFV           no                                             +--------------+---------+------+-----------+------------+--------+ FV prox       no                                             +--------------+---------+------+-----------+------------+--------+ FV mid        no                                             +--------------+---------+------+-----------+------------+--------+ FV dist       no                                             +--------------+---------+------+-----------+------------+--------+ Popliteal     no                                             +--------------+---------+------+-----------+------------+--------+ GSV at [facility]    no  0.64             +--------------+---------+------+-----------+------------+--------+ GSV prox  thighno                            0.58             +--------------+---------+------+-----------+------------+--------+ GSV mid thigh no                            0.53             +--------------+---------+------+-----------+------------+--------+ GSV dist thighno                            0.49             +--------------+---------+------+-----------+------------+--------+ GSV at knee   no                            0.54             +--------------+---------+------+-----------+------------+--------+ GSV prox calf no                            0.45             +--------------+---------+------+-----------+------------+--------+ SSV prox calf no                            0.25             +--------------+---------+------+-----------+------------+--------+ SSV mid calf  no                            0.20             +--------------+---------+------+-----------+------------+--------+  +--------------+---------+------+-----------+------------+--------+ LEFT          Reflux NoRefluxReflux TimeDiameter cmsComments                         Yes                                  +--------------+---------+------+-----------+------------+--------+ CFV           no                                             +--------------+---------+------+-----------+------------+--------+ FV prox       no                                             +--------------+---------+------+-----------+------------+--------+ FV mid        no                                             +--------------+---------+------+-----------+------------+--------+ FV dist       no                                             +--------------+---------+------+-----------+------------+--------+  Popliteal     no                                             +--------------+---------+------+-----------+------------+--------+ GSV at [facility]    no                            0.68              +--------------+---------+------+-----------+------------+--------+ GSV prox thighno                            0.54             +--------------+---------+------+-----------+------------+--------+ GSV mid thigh no                            0.50             +--------------+---------+------+-----------+------------+--------+ GSV dist thighno                            0.40             +--------------+---------+------+-----------+------------+--------+ GSV at knee   no                            0.42             +--------------+---------+------+-----------+------------+--------+ GSV prox calf no                            0.39             +--------------+---------+------+-----------+------------+--------+ SSV Pop Fossa no                            0.30             +--------------+---------+------+-----------+------------+--------+ SSV prox calf no                            0.25             +--------------+---------+------+-----------+------------+--------+ SSV mid calf  no                            0.28             +--------------+---------+------+-----------+------------+--------+   Summary: Right: - No evidence of deep vein thrombosis seen in the right lower extremity, from the common femoral through the popliteal veins. - No evidence of superficial venous thrombosis in the right lower extremity. - No evidence of superficial venous reflux seen in the right greater saphenous vein. - No evidence of superficial venous reflux seen in the right short saphenous vein. - Posterior tibial artery demonstrates brisk triphasic Doppler waveforms.  Left: - No evidence of deep vein thrombosis seen in the left lower extremity, from the common femoral through the popliteal veins. - No evidence of superficial venous thrombosis in the left lower extremity. - No evidence of superficial venous reflux seen in the left greater saphenous vein. - No evidence of  superficial venous reflux seen in the left short saphenous vein. -  Posterior tibial artery demonstrates brisk triphasic Doppler waveforms.  *See table(s) above for measurements and observations. Electronically signed by Devon Fogo MD on 02/06/2024 at 8:40:56 AM.    Final

## 2024-02-23 NOTE — Progress Notes (Unsigned)
 Referring Physician:  Jackquelyn Mass, MD [address] [place],  [place] [address]  Primary Physician:  Carollynn Cirri, NP  History of Present Illness: 02/29/2024 [name] has a history of SVT, HTN, asthma, DVT, prediabetes, obesity, hypercholesterolemia.   She was referred here by vascular.   She has 1 year history of constant LBP with right posterior leg pain to her foot. No left leg pain. LBP < right leg pain. No numbness or tingling. She has weakness and muscle spasms in her right leg. Pain is worse with standing, walking, and putting pressure on her right leg. Pain also worse with turning in bed. No alleviating factors. No known injury. History of intermittent back pain x years, never had leg pain before.   She is on ELIQUIS . She takes prn baclofen .   She does not smoke.   Bowel/Bladder Dysfunction: none  Conservative measures:  Physical therapy: none Multimodal medical therapy including regular antiinflammatories:  tylenol, baclofen  Injections:  no epidural steroid injections  Past Surgery: no spinal surgeries  Stacey Townsend has no symptoms of cervical myelopathy.  The symptoms are causing a significant impact on the patient's life.   Review of Systems:  A 10 point review of systems is negative, except for the pertinent positives and negatives detailed in the HPI.  Past Medical History: Past Medical History:  Diagnosis Date   Asthma    Family history of clotting disorder    History of DVT (deep vein thrombosis)    Hypertension    Osteoarthritis, knee    Supraventricular tachycardia, paroxysmal (HCC) 2017    Past Surgical History: Past Surgical History:  Procedure Laterality Date   COLONOSCOPY WITH PROPOFOL  N/A 11/04/2021   Procedure: COLONOSCOPY WITH PROPOFOL ;  Surgeon: Luke Salaam, MD;  Location: [facility] ENDOSCOPY;  Service: Gastroenterology;  Laterality: N/A;   DILATION AND CURETTAGE OF UTERUS  2013   endometerial  ablation  2016   HYSTEROSCOPY  2013    Allergies: Allergies as of 02/29/2024 - Review Complete 02/29/2024  Allergen Reaction Noted   Penicillins Shortness Of Breath 11/05/2013    Medications: Outpatient Encounter Medications as of 02/29/2024  Medication Sig   acetaminophen (TYLENOL) 500 MG tablet Take 500 mg by mouth every 8 (eight) hours as needed.   albuterol  (VENTOLIN  HFA) 108 (90 Base) MCG/ACT inhaler Inhale 2 puffs into the lungs every 4 (four) hours as needed for wheezing or shortness of breath.   apixaban  (ELIQUIS ) 2.5 MG TABS tablet Take 1 tablet (2.5 mg total) by mouth 2 (two) times daily.   baclofen  (LIORESAL ) 10 MG tablet Take 1 tablet (10 mg total) by mouth daily as needed for muscle spasms.   betamethasone dipropionate 0.05 % lotion Apply topically 2 (two) times daily.   cetirizine (ZYRTEC) 10 MG tablet Take 10 mg by mouth daily as needed.   ciclesonide (ALVESCO) 80 MCG/ACT inhaler Inhale 1 puff into the lungs 2 (two) times daily.   ketoconazole (NIZORAL) 2 % shampoo Apply 1 Application topically once.   lisinopril -hydrochlorothiazide  (ZESTORETIC ) 20-25 MG tablet TAKE ONE TABLET BY MOUTH ONE TIME DAILY   lovastatin  (MEVACOR ) 10 MG tablet TAKE ONE TABLET BY MOUTH AT BEDTIME   tacrolimus (PROTOPIC) 0.1 % ointment Apply 1 Application topically 2 (two) times daily.   triamcinolone (KENALOG) 0.025 % cream Apply 1 Application topically 2 (two) times daily.   No facility-administered encounter medications on file as of 02/29/2024.    Social History: Social History   Tobacco Use  Smoking status: Never   Smokeless tobacco: Never  Vaping Use   Vaping status: Former   Devices: only 2-3 months  Substance Use Topics   Alcohol use: Yes    Alcohol/week: 7.0 standard drinks of alcohol    Types: 7 Standard drinks or equivalent per week   Drug use: Never    Family Medical History: Family History  Problem Relation Age of Onset   Deep vein thrombosis Mother    Thyroid disease  Mother    Hypertension Father    Diabetes Father    Deep vein thrombosis Sister    Thyroid disease Sister    Hypertension Sister    Healthy Sister    Deep vein thrombosis Maternal Uncle    Deep vein thrombosis Paternal Aunt    Breast cancer Paternal Aunt 40       gene+? (half aunt)   Breast cancer Paternal Aunt 65       half aunt   Deep vein thrombosis Paternal Uncle    Breast cancer Paternal Grandmother 69    Physical Examination: Vitals:   02/29/24 1408  BP: 138/86    General: Patient is well developed, well nourished, calm, collected, and in no apparent distress. Attention to examination is appropriate.  Respiratory: Patient is breathing without any difficulty.   NEUROLOGICAL:     Awake, alert, oriented to person, place, and time.  Speech is clear and fluent. Fund of knowledge is appropriate.   Cranial Nerves: Pupils equal round and reactive to light.  Facial tone is symmetric.    Mild central posterior lumbar tenderness.   No abnormal lesions on exposed skin.   Strength: Side Biceps Triceps Deltoid Interossei Grip Wrist Ext. Wrist Flex.  R 5 5 5 5 5 5 5   L 5 5 5 5 5 5 5    Side Iliopsoas Quads Hamstring PF DF EHL  R 5 5 5 5 5 5   L 5 5 5 5 5 5    Reflexes are 2+ and symmetric at the biceps, brachioradialis, patella and achilles.   Hoffman's is absent.  Clonus is not present.   Bilateral upper and lower extremity sensation is intact to light touch.     No pain with IR/ER of both hips.   She has pain with ROM of left shoulder. She has mild tenderness at the shoulder with pain/weakness on stress of her rotator cuff.   Gait is normal.     Medical Decision Making  Imaging: No lumbar imaging.   Assessment and Plan: [name] 1 year history of constant LBP with right posterior leg pain to her foot. No left leg pain. LBP < right leg pain. No numbness or tingling. She has weakness and muscle spasms in her right leg.   No lumbar imaging. LBP and right leg  pain appear to be lumbar mediated.   Treatment options discussed with patient and following plan made:   - MRI of lumbar spine to further evaluate right lumbar radiculopathy. She request [facility].  - Referral to ortho at [facility] for left shoulder pain.  - Will schedule phone visit to review MRI results once I get them back.  - FMLA paper filled out for her to take frequent breaks and miss up to 4 days per month. Will do this for 6 months.   I spent a total of 35 minutes in face-to-face and non-face-to-face activities related to this patient's care today including review of outside records, review of imaging, review of symptoms, physical exam,  discussion of differential diagnosis, discussion of treatment options, and documentation.   Thank you for involving me in the care of this patient.   Lucetta Russel PA-C Dept. of Neurosurgery

## 2024-02-29 ENCOUNTER — Encounter: Payer: Self-pay | Admitting: Internal Medicine

## 2024-02-29 ENCOUNTER — Ambulatory Visit (INDEPENDENT_AMBULATORY_CARE_PROVIDER_SITE_OTHER): Admitting: Internal Medicine

## 2024-02-29 ENCOUNTER — Other Ambulatory Visit (HOSPITAL_COMMUNITY)
Admission: RE | Admit: 2024-02-29 | Discharge: 2024-02-29 | Disposition: A | Discharge status: Home / Self Care | Source: Ambulatory Visit | Attending: Internal Medicine | Admitting: Internal Medicine

## 2024-02-29 ENCOUNTER — Encounter: Payer: Self-pay | Admitting: Orthopedic Surgery

## 2024-02-29 ENCOUNTER — Ambulatory Visit: Admitting: Orthopedic Surgery

## 2024-02-29 VITALS — BP 138/86 | Ht 68.0 in | Wt 279.0 lb

## 2024-02-29 VITALS — BP 138/84 | Ht 68.0 in | Wt 279.6 lb

## 2024-02-29 DIAGNOSIS — G8929 Other chronic pain: Secondary | ICD-10-CM | POA: Diagnosis not present

## 2024-02-29 DIAGNOSIS — M5416 Radiculopathy, lumbar region: Secondary | ICD-10-CM | POA: Diagnosis not present

## 2024-02-29 DIAGNOSIS — Z1231 Encounter for screening mammogram for malignant neoplasm of breast: Secondary | ICD-10-CM

## 2024-02-29 DIAGNOSIS — R7303 Prediabetes: Secondary | ICD-10-CM | POA: Diagnosis not present

## 2024-02-29 DIAGNOSIS — Z124 Encounter for screening for malignant neoplasm of cervix: Secondary | ICD-10-CM | POA: Diagnosis present

## 2024-02-29 DIAGNOSIS — E7800 Pure hypercholesterolemia, unspecified: Secondary | ICD-10-CM | POA: Diagnosis not present

## 2024-02-29 DIAGNOSIS — Z113 Encounter for screening for infections with a predominantly sexual mode of transmission: Secondary | ICD-10-CM | POA: Diagnosis not present

## 2024-02-29 DIAGNOSIS — M25512 Pain in left shoulder: Secondary | ICD-10-CM

## 2024-02-29 DIAGNOSIS — Z0001 Encounter for general adult medical examination with abnormal findings: Secondary | ICD-10-CM | POA: Diagnosis not present

## 2024-02-29 DIAGNOSIS — E66813 Obesity, class 3: Secondary | ICD-10-CM

## 2024-02-29 DIAGNOSIS — M545 Low back pain, unspecified: Secondary | ICD-10-CM | POA: Diagnosis not present

## 2024-02-29 DIAGNOSIS — Z6841 Body Mass Index (BMI) 40.0 and over, adult: Secondary | ICD-10-CM

## 2024-02-29 NOTE — Patient Instructions (Signed)
 It was so nice to see you today. Thank you so much for coming in.    I think the pain in your back and right leg may be coming from your back.   I want to get an MRI of your lower back to look into things further. We will get this approved through your insurance and [facility] will call you to schedule the appointment. Ask about your patient responsibility. You do not need to pay this prior to getting MRI, they can bill you.   Make sure they schedule with [facility].   After you have the MRI, it takes 14-28 days for me to get the results back. If you don't hear anything in 2 weeks, let me know. Once I have them, we will call you to schedule a follow up phone visit with me to review them.   I want you to see ortho at the [facility] clinic for evaluation of your left shoulder. They should call you to schedule an appointment or you can call them at [phone].   Please do not hesitate to call if you have any questions or concerns. You can also message me in MyChart.   Lucetta Russel PA-C [phone]     The physicians and staff at [facility] at [facility] are committed to providing excellent care. You may receive a survey asking for feedback about your experience at our office. We value you your feedback and appreciate you taking the time to to fill it out. The [facility] leadership team is also available to discuss your experience in person, feel free to contact us  [phone].

## 2024-02-29 NOTE — Assessment & Plan Note (Signed)
 Encourage diet and exercise for weight loss

## 2024-02-29 NOTE — Patient Instructions (Signed)

## 2024-02-29 NOTE — Progress Notes (Signed)
 Subjective:    Patient ID: Stacey Townsend, female    DOB: 06-01-1976, 48 y.o.   MRN: [id number]  HPI  Patient presents to clinic today for her annual exam.  Flu: 07/2013 Tetanus: 12/2020 COVID: never Pap smear: 07/2018 Mammogram: > 2 years ago Colon screening: 10/2021 Vision screening: annually Dentist: as needed  Diet: She does eat meat. She consumes fruits and veggies. She does eat some fried foods. She drinks mostly water, juice, soda Exercise: Walking  Review of Systems     Past Medical History:  Diagnosis Date   Asthma    Family history of clotting disorder    History of DVT (deep vein thrombosis)    Hypertension    Osteoarthritis, knee    Supraventricular tachycardia, paroxysmal (HCC) 2017    Current Outpatient Medications  Medication Sig Dispense Refill   acetaminophen (TYLENOL) 500 MG tablet Take 500 mg by mouth every 8 (eight) hours as needed.     albuterol  (VENTOLIN  HFA) 108 (90 Base) MCG/ACT inhaler Inhale 2 puffs into the lungs every 4 (four) hours as needed for wheezing or shortness of breath. 1 each 5   apixaban  (ELIQUIS ) 2.5 MG TABS tablet Take 1 tablet (2.5 mg total) by mouth 2 (two) times daily. 180 tablet 1   baclofen  (LIORESAL ) 10 MG tablet Take 1 tablet (10 mg total) by mouth daily as needed for muscle spasms. 90 tablet 1   betamethasone dipropionate 0.05 % lotion Apply topically 2 (two) times daily.     cetirizine (ZYRTEC) 10 MG tablet Take 10 mg by mouth daily as needed.     ciclesonide (ALVESCO) 80 MCG/ACT inhaler Inhale 1 puff into the lungs 2 (two) times daily.     ketoconazole (NIZORAL) 2 % shampoo Apply 1 Application topically once.     lisinopril -hydrochlorothiazide  (ZESTORETIC ) 20-25 MG tablet TAKE ONE TABLET BY MOUTH ONE TIME DAILY 30 tablet 0   lovastatin  (MEVACOR ) 10 MG tablet TAKE ONE TABLET BY MOUTH AT BEDTIME 30 tablet 2   tacrolimus (PROTOPIC) 0.1 % ointment Apply 1 Application topically 2 (two) times daily.     triamcinolone  (KENALOG) 0.025 % cream Apply 1 Application topically 2 (two) times daily.     No current facility-administered medications for this visit.    Allergies  Allergen Reactions   Penicillins Shortness Of Breath    Hives and vomitting    Family History  Problem Relation Age of Onset   Deep vein thrombosis Mother    Thyroid disease Mother    Hypertension Father    Diabetes Father    Deep vein thrombosis Sister    Thyroid disease Sister    Hypertension Sister    Healthy Sister    Deep vein thrombosis Maternal Uncle    Deep vein thrombosis Paternal Aunt    Breast cancer Paternal Aunt 40       gene+? (half aunt)   Breast cancer Paternal Aunt 20       half aunt   Deep vein thrombosis Paternal Uncle    Breast cancer Paternal Grandmother 44    Social History   Socioeconomic History   Marital status: Single    Spouse name: Not on file   Number of children: Not on file   Years of education: Not on file   Highest education level: Not on file  Occupational History   Not on file  Tobacco Use   Smoking status: Never   Smokeless tobacco: Never  Vaping Use   Vaping status: Former  Devices: only 2-3 months  Substance and Sexual Activity   Alcohol use: Yes    Alcohol/week: 7.0 standard drinks of alcohol    Types: 7 Standard drinks or equivalent per week   Drug use: Never   Sexual activity: Yes  Other Topics Concern   Not on file  Social History Narrative   Not on file   Social Drivers of Health   Financial Resource Strain: Not on file  Food Insecurity: Not on file  Transportation Needs: Not on file  Physical Activity: Not on file  Stress: Not on file  Social Connections: Not on file  Intimate Partner Violence: Not on file     Constitutional: Denies fever, malaise, fatigue, headache or abrupt weight changes.  HEENT: Denies eye pain, eye redness, ear pain, ringing in the ears, wax buildup, runny nose, nasal congestion, bloody nose, or sore throat. Respiratory: Denies  difficulty breathing, shortness of breath, cough or sputum production.   Cardiovascular: Denies chest pain, chest tightness, palpitations or swelling in the hands or feet.  Gastrointestinal: Denies abdominal pain, bloating, constipation, diarrhea or blood in the stool.  GU: Denies urgency, frequency, pain with urination, burning sensation, blood in urine, odor or discharge. Musculoskeletal: Patient reports chronic joint pain.  Denies decrease in range of motion, difficulty with gait, muscle pain or joint swelling.  Skin: Denies redness, rashes, lesions or ulcercations.  Neurological: Denies dizziness, difficulty with memory, difficulty with speech or problems with balance and coordination.  Psych: Denies anxiety, depression, SI/HI.  No other specific complaints in a complete review of systems (except as listed in HPI above).  Objective:   Physical Exam BP 138/84 (BP Location: Left Arm, Patient Position: Sitting, Cuff Size: Large)   Ht 5' 8 (1.727 m)   Wt 279 lb 9.6 oz (126.8 kg)   LMP 01/29/2024 (Approximate)   BMI 42.51 kg/m    Wt Readings from Last 3 Encounters:  02/21/24 281 lb (127.5 kg)  02/01/24 276 lb (125.2 kg)  12/07/23 279 lb (126.6 kg)    General: Appears her stated age, obese, in NAD. Skin: Warm, dry and intact. HEENT: Head: normal shape and size; Eyes: sclera white, no icterus, conjunctiva pink, PERRLA and EOMs intact;  Neck:  Neck supple, trachea midline. No masses, lumps or thyromegaly present.  Cardiovascular: Normal rate and rhythm. S1,S2 noted.  No murmur, rubs or gallops noted. No JVD or BLE edema. Pulmonary/Chest: Normal effort and positive vesicular breath sounds. No respiratory distress. No wheezes, rales or ronchi noted.  Abdomen: Soft and nontender. Normal bowel sounds.  Pelvic: Normal female anatomy.  Cervix without mass or lesion noted.  No CMT.  Adnexa nonpalpable. Musculoskeletal: Strength 5/5 BUE/BLE. No difficulty with gait.  Neurological: Alert and  oriented. Cranial nerves II-XII grossly intact. Coordination normal.  Psychiatric: Mood and affect normal. Behavior is normal. Judgment and thought content normal.     BMET    Component Value Date/Time   NA 137 02/21/2024 1014   K 4.1 02/21/2024 1014   CL 102 02/21/2024 1014   CO2 29 02/21/2024 1014   GLUCOSE 107 (H) 02/21/2024 1014   BUN 16 02/21/2024 1014   CREATININE 0.88 02/21/2024 1014   CREATININE 1.02 (H) 11/23/2022 1058   CALCIUM 8.8 (L) 02/21/2024 1014   GFRNONAA >60 02/21/2024 1014    Lipid Panel     Component Value Date/Time   CHOL 206 (H) 06/28/2023 1129   TRIG 114 06/28/2023 1129   HDL 55 06/28/2023 1129   CHOLHDL 3.7 06/28/2023  1129   LDLCALC 129 (H) 06/28/2023 1129    CBC    Component Value Date/Time   WBC 6.2 02/21/2024 1013   WBC 7.2 02/17/2023 1132   RBC 4.90 02/21/2024 1013   HGB 12.4 02/21/2024 1013   HCT 38.9 02/21/2024 1013   PLT 220 02/21/2024 1013   MCV 79.4 (L) 02/21/2024 1013   MCH 25.3 (L) 02/21/2024 1013   MCHC 31.9 02/21/2024 1013   RDW 14.9 02/21/2024 1013   LYMPHSABS 2.0 02/21/2024 1013   MONOABS 0.4 02/21/2024 1013   EOSABS 0.2 02/21/2024 1013   BASOSABS 0.1 02/21/2024 1013    Hgb A1C Lab Results  Component Value Date   HGBA1C 6.2 (H) 06/28/2023          Assessment & Plan:   Preventative Health Maintenance:  Encouraged her to get a flu shot in fall Tetanus UTD Encouraged her to get her COVID booster Pap smear today Mammogram ordered-she will call to schedule Colon screening UTD Encouraged her to consume a balanced diet and exercise regimen Advised her seeing eye doctor and dentist annually We will check CBC, c-Met, lipid, A1c today  RTC in 6 months, follow-up chronic conditions Helayne Lo, NP

## 2024-03-01 ENCOUNTER — Ambulatory Visit: Payer: Self-pay | Admitting: Internal Medicine

## 2024-03-01 LAB — LIPID PANEL
Cholesterol: 248 mg/dL — ABNORMAL HIGH (ref <200)
HDL: 58 mg/dL (ref >=50)
LDL Cholesterol (Calc): 168 mg/dL — ABNORMAL HIGH
Non-HDL Cholesterol (Calc): 190 mg/dL — ABNORMAL HIGH (ref <130)
Total CHOL/HDL Ratio: 4.3 (calc) (ref <5.0)
Triglycerides: 101 mg/dL (ref <150)

## 2024-03-01 LAB — CBC
HCT: 41 % (ref 35.0–45.0)
Hemoglobin: 13 g/dL (ref 11.7–15.5)
MCH: 25.3 pg — ABNORMAL LOW (ref 27.0–33.0)
MCHC: 31.7 g/dL — ABNORMAL LOW (ref 32.0–36.0)
MCV: 79.8 fL — ABNORMAL LOW (ref 80.0–100.0)
MPV: 11.9 fL (ref 7.5–12.5)
Platelets: 224 10*3/uL (ref 140–400)
RBC: 5.14 10*6/uL — ABNORMAL HIGH (ref 3.80–5.10)
RDW: 14.2 % (ref 11.0–15.0)
WBC: 5.3 10*3/uL (ref 3.8–10.8)

## 2024-03-01 LAB — COMPREHENSIVE METABOLIC PANEL WITH GFR
AG Ratio: 2 (calc) (ref 1.0–2.5)
ALT: 14 U/L (ref 6–29)
AST: 14 U/L (ref 10–35)
Albumin: 4.5 g/dL (ref 3.6–5.1)
Alkaline phosphatase (APISO): 51 U/L (ref 31–125)
BUN: 15 mg/dL (ref 7–25)
CO2: 28 mmol/L (ref 20–32)
Calcium: 9.1 mg/dL (ref 8.6–10.2)
Chloride: 102 mmol/L (ref 98–110)
Creat: 0.84 mg/dL (ref 0.50–0.99)
Globulin: 2.3 g/dL (ref 1.9–3.7)
Glucose, Bld: 88 mg/dL (ref 65–139)
Potassium: 4 mmol/L (ref 3.5–5.3)
Sodium: 139 mmol/L (ref 135–146)
Total Bilirubin: 0.4 mg/dL (ref 0.2–1.2)
Total Protein: 6.8 g/dL (ref 6.1–8.1)
eGFR: 85 mL/min/{1.73_m2} (ref >=60)

## 2024-03-01 LAB — HEMOGLOBIN A1C
Hgb A1c MFr Bld: 6.2 % — ABNORMAL HIGH (ref <5.7)
Mean Plasma Glucose: 131 mg/dL
eAG (mmol/L): 7.3 mmol/L

## 2024-03-06 LAB — CYTOLOGY - PAP
Chlamydia: NEGATIVE
Comment: NEGATIVE
Comment: NEGATIVE
Comment: NEGATIVE
Comment: NORMAL
Diagnosis: NEGATIVE
High risk HPV: NEGATIVE
Neisseria Gonorrhea: NEGATIVE
Trichomonas: NEGATIVE

## 2024-03-15 ENCOUNTER — Other Ambulatory Visit: Payer: Self-pay | Admitting: Internal Medicine

## 2024-03-19 NOTE — Telephone Encounter (Signed)
 Requested Prescriptions  Pending Prescriptions Disp Refills   lisinopril -hydrochlorothiazide  (ZESTORETIC ) 20-25 MG tablet [Pharmacy Med Name: LISINOPRIL -HCTZ 20-25 MG TAB[*]] 90 tablet 1    Sig: TAKE ONE TABLET BY MOUTH ONE TIME DAILY     Cardiovascular:  ACEI + Diuretic Combos Passed - 03/19/2024 11:11 AM      Passed - Na in normal range and within 180 days    Sodium  Date Value Ref Range Status  02/29/2024 139 135 - 146 mmol/L Final         Passed - K in normal range and within 180 days    Potassium  Date Value Ref Range Status  02/29/2024 4.0 3.5 - 5.3 mmol/L Final         Passed - Cr in normal range and within 180 days    Creat  Date Value Ref Range Status  02/29/2024 0.84 0.50 - 0.99 mg/dL Final         Passed - eGFR is 30 or above and within 180 days    GFR, Estimated  Date Value Ref Range Status  02/21/2024 >60 >60 mL/min Final    Comment:    (NOTE) Calculated using the CKD-EPI Creatinine Equation (2021)    eGFR  Date Value Ref Range Status  02/29/2024 85 > OR = 60 mL/min/1.106m2 Final         Passed - Patient is not pregnant      Passed - Last BP in normal range    BP Readings from Last 1 Encounters:  02/29/24 138/86         Passed - Valid encounter within last 6 months    Recent Outpatient Visits           2 weeks ago Encounter for general adult medical examination with abnormal findings   [place] [facility] [place], Angeline ORN, NP

## 2024-04-23 ENCOUNTER — Encounter: Payer: Self-pay | Admitting: Internal Medicine

## 2024-04-25 ENCOUNTER — Ambulatory Visit
Admission: RE | Admit: 2024-04-25 | Discharge: 2024-04-25 | Disposition: A | Discharge status: Home / Self Care | Source: Ambulatory Visit | Attending: Orthopedic Surgery | Admitting: Orthopedic Surgery

## 2024-04-25 DIAGNOSIS — G8929 Other chronic pain: Secondary | ICD-10-CM

## 2024-04-25 DIAGNOSIS — M5416 Radiculopathy, lumbar region: Secondary | ICD-10-CM

## 2024-05-09 NOTE — Progress Notes (Deleted)
 My Chart Video Visit- Progress Note: Referring Physician:  Antonette Angeline ORN, NP [address] [place],  [place] [address]  Primary Physician:  Antonette Angeline ORN, NP  This visit was performed via MyChart/video.   Patient location: home Provider location: working from home  I spent a total of *** minutes non-face-to-face activities for this visit on the date of this encounter including review of current clinical condition and response to treatment.    Patient has given verbal consent to this MyChart video visit and we reviewed the limitations of a MyChart video visit. Patient wishes to proceed.    Chief Complaint:  ***  History of Present Illness: [name] has a history of SVT, HTN, asthma, DVT, prediabetes, obesity, hypercholesterolemia.   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 She is in PT for her left shoulder.   Last seen by me on 02/29/24 for constant LBP with right posterior leg pain to her foot.   MRI of lumbar spine was ordered and MyChart visit scheduled to review it.        She was referred here by vascular.   She has 1 year history of constant LBP with right posterior leg pain to her foot. No left leg pain. LBP < right leg pain. No numbness or tingling. She has weakness and muscle spasms in her right leg. Pain is worse with standing, walking, and putting pressure on her right leg. Pain also worse with turning in bed. No alleviating factors. No known injury. History of intermittent back pain x years, never had leg pain before.   She is on ELIQUIS . She takes prn baclofen .   She does not smoke.   Bowel/Bladder Dysfunction: none  Conservative measures:  Physical therapy: none Multimodal medical therapy including regular antiinflammatories:  tylenol, baclofen  Injections:  no epidural steroid injections  Past Surgery: no spinal surgeries  Stacey Townsend has no symptoms of cervical  myelopathy.  The symptoms are causing a significant impact on the patient's life.    Exam: General: Patient is well developed, well nourished, calm, collected, and in no apparent distress. Attention to examination is appropriate.  Respiratory: Patient is breathing without any difficulty.    Awake, alert, oriented to person, place, and time.  Speech is clear and fluent. Fund of knowledge is appropriate.   ***    Imaging: Lumbar MRI dated 04/25/24:  FINDINGS: Segmentation: Sacralization of the L5 vertebra.   Alignment:  Grade 1 anterolisthesis of L3 on L4.   Vertebrae: Degenerative endplate marrow changes at a few levels. No compression fractures.   Conus medullaris and cauda equina: The conus medullaris terminates at the level of L1-L2. The distal spinal cord signal intensity is normal.   Paraspinal and other soft tissues: The visualized abdomen and pelvis show no soft tissue abnormality. The visualized aorta is normal.   Disc levels:   L1-L2: Disc is normal in configuration. Mild bilateral facet arthropathy. No neuroforaminal stenosis. No spinal canal stenosis.   L2-L3: Disc is normal in configuration. Moderate bilateral facet arthropathy. No neuroforaminal stenosis. No spinal canal stenosis.   L3-L4: Disc bulge. Severe bilateral facet arthropathy. No neuroforaminal stenosis. Mild spinal canal stenosis.   L4-L5: Disc bulge. Severe bilateral facet arthropathy. Moderate bilateral neuroforaminal stenosis. Moderate spinal canal stenosis.   L5-S1: Disc is normal in configuration. Mild bilateral facet arthropathy. No neuroforaminal stenosis. No spinal canal stenosis.   IMPRESSION: 1. Sacralization of the L5 vertebra. 2. Moderate  canal and foraminal stenoses at L4-L5 secondary to disc bulging and facet arthropathy. 3. Mild canal stenosis at L3-L4 secondary to disc bulging and facet arthropathy.     Electronically Signed   By: Clem Savory M.D.   On: 05/07/2024  11:14  I have personally reviewed the images and agree with the above interpretation.  Assessment and Plan: Ms. Bachicha 1 year history of constant LBP with right posterior leg pain to her foot. No left leg pain. LBP < right leg pain. No numbness or tingling. She has weakness and muscle spasms in her right leg.    No lumbar imaging. LBP and right leg pain appear to be lumbar mediated.    Treatment options discussed with patient and following plan made:    - MRI of lumbar spine to further evaluate right lumbar radiculopathy. She request WIDE BORE MRI.  - Referral to ortho at [facility] for left shoulder pain.  - Will schedule phone visit to review MRI results once I get them back.  - FMLA paper filled out for her to take frequent breaks and miss up to 4 days per month. Will do this for 6 months.  Glade Boys PA-C Neurosurgery

## 2024-05-10 ENCOUNTER — Encounter: Payer: Self-pay | Admitting: Orthopedic Surgery

## 2024-05-10 ENCOUNTER — Ambulatory Visit (INDEPENDENT_AMBULATORY_CARE_PROVIDER_SITE_OTHER): Admitting: Orthopedic Surgery

## 2024-05-10 ENCOUNTER — Telehealth: Admitting: Orthopedic Surgery

## 2024-05-10 DIAGNOSIS — M4316 Spondylolisthesis, lumbar region: Secondary | ICD-10-CM | POA: Diagnosis not present

## 2024-05-10 DIAGNOSIS — M47816 Spondylosis without myelopathy or radiculopathy, lumbar region: Secondary | ICD-10-CM

## 2024-05-10 DIAGNOSIS — M48061 Spinal stenosis, lumbar region without neurogenic claudication: Secondary | ICD-10-CM | POA: Diagnosis not present

## 2024-05-10 DIAGNOSIS — M5416 Radiculopathy, lumbar region: Secondary | ICD-10-CM

## 2024-05-10 NOTE — Progress Notes (Signed)
 My Chart Video Visit- Progress Note: changed to phone visit due to tech issues  Referring Physician:  Antonette Townsend ORN, NP [address] [place],  [place] [address]  Primary Physician:  Stacey Townsend ORN, NP  This visit was performed via phone due to tech issues with MyChart/video.   Patient location: home Provider location: working from home  I spent a total of 15 minutes non-face-to-face activities for this visit on the date of this encounter including review of current clinical condition and response to treatment.    Patient has given verbal consent to this MyChart video/phone visit and we reviewed the limitations of a MyChart video/phone visit. Patient wishes to proceed.    Chief Complaint:  review imaging  History of Present Illness: [name] has a history of SVT, HTN, asthma, DVT, prediabetes, obesity, hypercholesterolemia.   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 She is in PT for her left shoulder.   Last seen by me on 02/29/24 for constant LBP with right posterior leg pain to her foot.   MRI of lumbar spine was ordered and MyChart visit scheduled to review it.   She has seen some improvement in left shoulder pain since starting PT at [facility].   She is about the same regarding her back. She has constant LBP with right posterior leg pain to her foot. No left leg pain. LBP < right leg pain. No numbness or tingling. She has weakness in her right leg. Pain is worse with standing, walking, and putting pressure on her right leg. No alleviating factors.   She is on ELIQUIS . She takes prn baclofen .   She does not smoke.   Bowel/Bladder Dysfunction: none  Conservative measures:  Physical therapy: none Multimodal medical therapy including regular antiinflammatories:  tylenol, baclofen  Injections:  no epidural steroid injections  Past Surgery: no spinal surgeries  Stacey Townsend has no symptoms of cervical  myelopathy.  The symptoms are causing a significant impact on the patient's life.    Exam: No exam as this was changed to a phone visit.     Imaging: Lumbar MRI dated 04/25/24:  FINDINGS: Segmentation: Sacralization of the L5 vertebra.   Alignment:  Grade 1 anterolisthesis of L3 on L4.   Vertebrae: Degenerative endplate marrow changes at a few levels. No compression fractures.   Conus medullaris and cauda equina: The conus medullaris terminates at the level of L1-L2. The distal spinal cord signal intensity is normal.   Paraspinal and other soft tissues: The visualized abdomen and pelvis show no soft tissue abnormality. The visualized aorta is normal.   Disc levels:   L1-L2: Disc is normal in configuration. Mild bilateral facet arthropathy. No neuroforaminal stenosis. No spinal canal stenosis.   L2-L3: Disc is normal in configuration. Moderate bilateral facet arthropathy. No neuroforaminal stenosis. No spinal canal stenosis.   L3-L4: Disc bulge. Severe bilateral facet arthropathy. No neuroforaminal stenosis. Mild spinal canal stenosis.   L4-L5: Disc bulge. Severe bilateral facet arthropathy. Moderate bilateral neuroforaminal stenosis. Moderate spinal canal stenosis.   L5-S1: Disc is normal in configuration. Mild bilateral facet arthropathy. No neuroforaminal stenosis. No spinal canal stenosis.   IMPRESSION: 1. Sacralization of the L5 vertebra. 2. Moderate canal and foraminal stenoses at L4-L5 secondary to disc bulging and facet arthropathy. 3. Mild canal stenosis at L3-L4 secondary to disc bulging and facet arthropathy.     Electronically Signed   By: Clem Savory M.D.   On: 05/07/2024  11:14  I have personally reviewed the images and agree with the above interpretation.  Assessment and Plan: Ms. Biagini continues with constant LBP with right posterior leg pain to her foot. No left leg pain. LBP < right leg pain. No numbness or tingling. She has weakness in her  right leg.    She has known diffuse lumbar spondyosis. She has slip at L3-L4 with mild central stenosis along with moderate central and bilateral foraminal stenosis L4-L5.    Treatment options discussed with patient and following plan made:  - PT for lumbar spine. She would like to go to [facility] (going there for her shoulder now). Message to Dr. Kathlynn to see if he would write orders.  - Referral to PMR at [facility] to discuss possible lumbar injections.  - Of note, she is on ELIQUIS .  - May consider lumbar xrays with flex/ext at next visit.  - Follow up with me in 6-8 weeks.   Glade Boys PA-C Neurosurgery

## 2024-06-25 ENCOUNTER — Telehealth: Payer: Self-pay | Admitting: Orthopedic Surgery

## 2024-06-25 NOTE — Telephone Encounter (Signed)
 Left voicemail to reschedule, provider is not in the office on this date.

## 2024-07-15 NOTE — Progress Notes (Unsigned)
 Referring Physician:  Antonette Angeline ORN, NP [address] [place],  [place] [address]  Primary Physician:  Antonette Angeline ORN, NP  History of Present Illness: Ms. Shamaya Kauer has a history of SVT, HTN, asthma, DVT, prediabetes, obesity, hypercholesterolemia.   She did a phone visit with me on 05/10/24 to review her lumbar MRI.   She has known diffuse lumbar spondyosis. She has slip at L3-L4 with mild central stenosis along with moderate central and bilateral foraminal stenosis L4-L5.   She was sent to PT and also to PMR at her last visit.   She was in PT for her left arm. Had initial PT evaluation for her back on 07/10/24.   She had bilateral L5-S1 TF ESI with Dr. Avanell on 07/04/24.   She is here for follow up.   She has seen good relief with her injection and PT. She is walking much better. Her pain is only a 3, it was a 5+. Weakness in her right leg is better. She still has back and right leg pain but it is less severe.   She continues with constant LBP with right posterior leg pain to her foot. No left leg pain. LBP < right leg pain. No numbness or tingling. She has muscle spasms in her right leg.   She is on ELIQUIS . She takes prn baclofen .   She does not smoke.   Bowel/Bladder Dysfunction: none  Conservative measures:  Physical therapy: initial PT evaluation at [facility] for her back on 07/10/24, one visit on 07/15/24.  Multimodal medical therapy including regular antiinflammatories:  tylenol, baclofen  Injections:   07/04/2024: Bilateral L5-S1 transforaminal ESI (sacralization of L5)   Past Surgery: no spinal surgeries  JOANA NOLTON has no symptoms of cervical myelopathy.  The symptoms are causing a significant impact on the patient's life.   Review of Systems:  A 10 point review of systems is negative, except for the pertinent positives and negatives detailed in the HPI.  Past Medical History: Past Medical History:  Diagnosis Date   Asthma    Family history of  clotting disorder    History of DVT (deep vein thrombosis)    Hypertension    Osteoarthritis, knee    Supraventricular tachycardia, paroxysmal 2017    Past Surgical History: Past Surgical History:  Procedure Laterality Date   COLONOSCOPY WITH PROPOFOL  N/A 11/04/2021   Procedure: COLONOSCOPY WITH PROPOFOL ;  Surgeon: Therisa Bi, MD;  Location: [facility] ENDOSCOPY;  Service: Gastroenterology;  Laterality: N/A;   DILATION AND CURETTAGE OF UTERUS  2013   endometerial ablation  2016   HYSTEROSCOPY  2013    Allergies: Allergies as of 07/18/2024 - Review Complete 07/18/2024  Allergen Reaction Noted   Penicillins Shortness Of Breath 11/05/2013    Medications: Outpatient Encounter Medications as of 07/18/2024  Medication Sig   acetaminophen (TYLENOL) 500 MG tablet Take 500 mg by mouth every 8 (eight) hours as needed.   albuterol  (VENTOLIN  HFA) 108 (90 Base) MCG/ACT inhaler Inhale 2 puffs into the lungs every 4 (four) hours as needed for wheezing or shortness of breath.   apixaban  (ELIQUIS ) 2.5 MG TABS tablet Take 1 tablet (2.5 mg total) by mouth 2 (two) times daily.   baclofen  (LIORESAL ) 10 MG tablet Take 1 tablet (10 mg total) by mouth daily as needed for muscle spasms.   betamethasone dipropionate 0.05 % lotion Apply topically 2 (two) times daily.   cetirizine (ZYRTEC) 10 MG tablet Take 10 mg by mouth daily as needed.  ciclesonide (ALVESCO) 80 MCG/ACT inhaler Inhale 1 puff into the lungs 2 (two) times daily.   ketoconazole (NIZORAL) 2 % shampoo Apply 1 Application topically once.   lisinopril -hydrochlorothiazide  (ZESTORETIC ) 20-25 MG tablet TAKE ONE TABLET BY MOUTH ONE TIME DAILY   lovastatin  (MEVACOR ) 10 MG tablet TAKE ONE TABLET BY MOUTH AT BEDTIME   tacrolimus (PROTOPIC) 0.1 % ointment Apply 1 Application topically 2 (two) times daily.   triamcinolone (KENALOG) 0.025 % cream Apply 1 Application topically 2 (two) times daily.   No facility-administered encounter medications on file as of  07/18/2024.    Social History: Social History   Tobacco Use   Smoking status: Never   Smokeless tobacco: Never  Vaping Use   Vaping status: Former   Devices: only 2-3 months  Substance Use Topics   Alcohol use: Yes    Alcohol/week: 7.0 standard drinks of alcohol    Types: 7 Standard drinks or equivalent per week   Drug use: Never    Family Medical History: Family History  Problem Relation Age of Onset   Deep vein thrombosis Mother    Thyroid disease Mother    Hypertension Father    Diabetes Father    Deep vein thrombosis Sister    Thyroid disease Sister    Hypertension Sister    Healthy Sister    Deep vein thrombosis Maternal Uncle    Deep vein thrombosis Paternal Aunt    Breast cancer Paternal Aunt 40       gene+? (half aunt)   Breast cancer Paternal Aunt 75       half aunt   Deep vein thrombosis Paternal Uncle    Breast cancer Paternal Grandmother 65    Physical Examination: Vitals:   07/18/24 0941 07/18/24 0955  BP: (!) 182/84 (!) 158/80      Awake, alert, oriented to person, place, and time.  Speech is clear and fluent. Fund of knowledge is appropriate.   Cranial Nerves: Pupils equal round and reactive to light.  Facial tone is symmetric.    No abnormal lesions on exposed skin.   Strength:  Side Iliopsoas Quads Hamstring PF DF EHL  R 5 5 5 5 5 5   L 5 5 5 5 5 5    Reflexes are 2+ and symmetric at the patella and achilles.    Clonus is not present.   Bilateral lower extremity sensation is intact to light touch.     No pain with IR/ER of both hips.   Gait is normal.     Medical Decision Making  Imaging: None  Assessment and Plan: Ms. Torelli has seen good relief with her injection and PT. She is walking much better. Weakness in her right leg is better. She still has back and right leg pain but it is less severe.   She continues with constant LBP with right posterior leg pain to her foot. No numbness or tingling. She has muscle spasms in her  right leg.   She has known diffuse lumbar spondyosis. She has slip at L3-L4 with mild central stenosis along with moderate central and bilateral foraminal stenosis L4-L5.    Treatment options discussed with patient and following plan made:  - Continue with PT for lumbar spine.  - Follow up with PMR as scheduled.  - Follow up with me in 2-3 months.  - If pain is worse, consider lumbar xrays with flex/ext.  - If no improvement with above, she may be surgical candidate. Would need to discuss weight loss. She  would like to avoid surgery if possible.   BP was elevated. No symptoms of chest pain, shortness of breath, blurry vision, or headaches. She will recheck at home and call PCP if not improved. If she develops CP, SOB, blurry vision, or headaches, then she will go to ED.     I spent a total of 20 minutes in face-to-face and non-face-to-face activities related to this patient's care today including review of outside records, review of imaging, review of symptoms, physical exam, discussion of differential diagnosis, discussion of treatment options, and documentation.   Glade Boys PA-C Dept. of Neurosurgery

## 2024-07-18 ENCOUNTER — Encounter: Payer: Self-pay | Admitting: Orthopedic Surgery

## 2024-07-18 ENCOUNTER — Ambulatory Visit: Admitting: Orthopedic Surgery

## 2024-07-18 VITALS — BP 158/80 | Ht 67.5 in | Wt 288.5 lb

## 2024-07-18 DIAGNOSIS — M4316 Spondylolisthesis, lumbar region: Secondary | ICD-10-CM

## 2024-07-18 DIAGNOSIS — M5126 Other intervertebral disc displacement, lumbar region: Secondary | ICD-10-CM | POA: Diagnosis not present

## 2024-07-18 DIAGNOSIS — M47816 Spondylosis without myelopathy or radiculopathy, lumbar region: Secondary | ICD-10-CM | POA: Diagnosis not present

## 2024-07-18 DIAGNOSIS — M48061 Spinal stenosis, lumbar region without neurogenic claudication: Secondary | ICD-10-CM

## 2024-07-18 DIAGNOSIS — M5416 Radiculopathy, lumbar region: Secondary | ICD-10-CM

## 2024-07-18 NOTE — Patient Instructions (Signed)
 It was so nice to see you today. Thank you so much for coming in.    I am so glad you are feeling better!  Continue with PT and let me know if you need anything.   Your blood pressure was elevated today. I want you to recheck it at home and follow up with your PCP if it remains high. If you have any chest pain, shortness of breath, blurry vision, or headaches then you need to go to ED.    I will see you back in February. Please do not hesitate to call if you have any questions or concerns. You can also message me in MyChart.   Glade Boys PA-C [phone]     The physicians and staff at [facility] at [facility] are committed to providing excellent care. You may receive a survey asking for feedback about your experience at our office. We value you your feedback and appreciate you taking the time to to fill it out. The [facility] leadership team is also available to discuss your experience in person, feel free to contact us  [phone].

## 2024-08-28 NOTE — Progress Notes (Deleted)
 Subjective:    Patient ID: Stacey Townsend, female    DOB: 1975-09-23, 48 y.o.   MRN: [id number]  HPI  Patient presents the clinic today for 5-month follow-up of chronic conditions.  Asthma: Moderate, persistent.  She is taking ciclesonide and albuterol  as prescribed.  There are no PFTs on file.  She does not follow with pulmonology.  HTN: Her BP today is 124/84.  She is taking lisinopril  HCT as prescribed.  There is no ECG on file.  History of left DVT, family history of clotting disorder: Managed with apixaban .  She follows with hematology.  Chronic back/knee pain.  Managed with tylenol arthritis and baclofen  as prescribed. She has had an epidural steroid injection but does feel like this made her pain worse. She is currently undergoing PT.  She follows with neurosurgery.  HLD: Her last LDL was 168, triglycerides 101, 02/2024.  She denies myalgias on lovastatin .  She tries to consume a low-fat diet.  Prediabetes: Her last A1c was 6.2%, 02/2024.  She is not taking any oral diabetic medication this time.  She does not check her sugars.  Review of Systems     Past Medical History:  Diagnosis Date   Asthma    Family history of clotting disorder    History of DVT (deep vein thrombosis)    Hypertension    Osteoarthritis, knee    Supraventricular tachycardia, paroxysmal 2017    Current Outpatient Medications  Medication Sig Dispense Refill   acetaminophen (TYLENOL) 500 MG tablet Take 500 mg by mouth every 8 (eight) hours as needed.     albuterol  (VENTOLIN  HFA) 108 (90 Base) MCG/ACT inhaler Inhale 2 puffs into the lungs every 4 (four) hours as needed for wheezing or shortness of breath. 1 each 5   apixaban  (ELIQUIS ) 2.5 MG TABS tablet Take 1 tablet (2.5 mg total) by mouth 2 (two) times daily. 180 tablet 1   baclofen  (LIORESAL ) 10 MG tablet Take 1 tablet (10 mg total) by mouth daily as needed for muscle spasms. 90 tablet 1   betamethasone dipropionate 0.05 % lotion Apply  topically 2 (two) times daily.     cetirizine (ZYRTEC) 10 MG tablet Take 10 mg by mouth daily as needed.     ciclesonide (ALVESCO) 80 MCG/ACT inhaler Inhale 1 puff into the lungs 2 (two) times daily.     ketoconazole (NIZORAL) 2 % shampoo Apply 1 Application topically once.     lisinopril -hydrochlorothiazide  (ZESTORETIC ) 20-25 MG tablet TAKE ONE TABLET BY MOUTH ONE TIME DAILY 90 tablet 1   lovastatin  (MEVACOR ) 10 MG tablet TAKE ONE TABLET BY MOUTH AT BEDTIME 30 tablet 2   tacrolimus (PROTOPIC) 0.1 % ointment Apply 1 Application topically 2 (two) times daily.     triamcinolone (KENALOG) 0.025 % cream Apply 1 Application topically 2 (two) times daily.     No current facility-administered medications for this visit.    Allergies  Allergen Reactions   Penicillins Shortness Of Breath    Hives and vomitting    Family History  Problem Relation Age of Onset   Deep vein thrombosis Mother    Thyroid disease Mother    Hypertension Father    Diabetes Father    Deep vein thrombosis Sister    Thyroid disease Sister    Hypertension Sister    Healthy Sister    Deep vein thrombosis Maternal Uncle    Deep vein thrombosis Paternal Aunt    Breast cancer Paternal Aunt 5  gene+? (half aunt)   Breast cancer Paternal Aunt 21       half aunt   Deep vein thrombosis Paternal Uncle    Breast cancer Paternal Grandmother 68    Social History   Socioeconomic History   Marital status: Single    Spouse name: Not on file   Number of children: Not on file   Years of education: Not on file   Highest education level: Not on file  Occupational History   Not on file  Tobacco Use   Smoking status: Never   Smokeless tobacco: Never  Vaping Use   Vaping status: Former   Devices: only 2-3 months  Substance and Sexual Activity   Alcohol use: Yes    Alcohol/week: 7.0 standard drinks of alcohol    Types: 7 Standard drinks or equivalent per week   Drug use: Never   Sexual activity: Yes  Other  Topics Concern   Not on file  Social History Narrative   Not on file   Social Drivers of Health   Tobacco Use: Low Risk (07/18/2024)   Patient History    Smoking Tobacco Use: Never    Smokeless Tobacco Use: Never    Passive Exposure: Not on file  Financial Resource Strain: Low Risk  (06/06/2024)   Received from [facility]   Overall Financial Resource Strain (CARDIA)    Difficulty of Paying Living Expenses: Not very hard  Food Insecurity: No Food Insecurity (06/06/2024)   Received from [facility]   Epic    Within the past 12 months, you worried that your food would run out before you got the money to buy more.: Never true    Within the past 12 months, the food you bought just didn't last and you didn't have money to get more.: Never true  Transportation Needs: No Transportation Needs (06/06/2024)   Received from [facility] - Transportation    In the past 12 months, has lack of transportation kept you from medical appointments or from getting medications?: No    Lack of Transportation (Non-Medical): No  Physical Activity: Not on file  Stress: Not on file  Social Connections: Not on file  Intimate Partner Violence: Not on file  Depression (PHQ2-9): Low Risk (02/29/2024)   Depression (PHQ2-9)    PHQ-2 Score: 1  Alcohol Screen: Low Risk (09/16/2022)   Alcohol Screen    Last Alcohol Screening Score (AUDIT): 5  Housing: Low Risk  (06/06/2024)   Received from [facility]   Epic    In the last 12 months, was there a time when you were not able to pay the mortgage or rent on time?: No    In the past 12 months, how many times have you moved where you were living?: 0    At any time in the past 12 months, were you homeless or living in a shelter (including now)?: No  Utilities: Not At Risk (06/06/2024)   Received from [facility]   Epic    In the past 12 months has the electric, gas, oil, or  water company threatened to shut off services in your home?: No  Health Literacy: Not on file     Constitutional: Denies fever, malaise, fatigue, headache or abrupt weight changes.  HEENT: Denies eye pain, eye redness, ear pain, ringing in the ears, wax buildup, runny nose, nasal congestion, bloody nose, or sore throat. Respiratory: Denies difficulty breathing, shortness  of breath, cough or sputum production.   Cardiovascular: Patient reports intermittent swelling in legs.  Denies chest pain, chest tightness, palpitations or swelling in the hands.  Gastrointestinal: Denies abdominal pain, bloating, constipation, diarrhea or blood in the stool.  GU: Denies urgency, frequency, pain with urination, burning sensation, blood in urine, odor or discharge. Musculoskeletal: Patient reports back/knee pain.  Denies decrease in range of motion, difficulty with gait, muscle pain or joint swelling.  Skin: Denies redness, rashes, lesions or ulcercations.  Neurological: Denies dizziness, difficulty with memory, difficulty with speech or problems with balance and coordination.  Psych: Denies anxiety, depression, SI/HI.  No other specific complaints in a complete review of systems (except as listed in HPI above).  Objective:   Physical Exam  There were no vitals taken for this visit.  Wt Readings from Last 3 Encounters:  07/18/24 288 lb 8 oz (130.9 kg)  02/29/24 279 lb (126.6 kg)  02/29/24 279 lb 9.6 oz (126.8 kg)    General: Appears her stated age, obese in NAD. Skin: Warm, dry and intact.  HEENT: Head: normal shape and size; Eyes: sclera white, no icterus, conjunctiva pink, PERRLA and EOMs intact;  Cardiovascular: Normal rate and rhythm. S1,S2 noted.  No murmur, rubs or gallops noted. No JVD or BLE edema.  Pulmonary/Chest: Normal effort and positive vesicular breath sounds. No respiratory distress. No wheezes, rales or ronchi noted.  Musculoskeletal: No difficulty with gait.  Neurological: Alert  and oriented. Coordination normal.  Psychiatric: Mood and affect normal. Behavior is normal. Judgment and thought content normal.    BMET    Component Value Date/Time   NA 139 02/29/2024 1140   K 4.0 02/29/2024 1140   CL 102 02/29/2024 1140   CO2 28 02/29/2024 1140   GLUCOSE 88 02/29/2024 1140   BUN 15 02/29/2024 1140   CREATININE 0.84 02/29/2024 1140   CALCIUM 9.1 02/29/2024 1140   GFRNONAA >60 02/21/2024 1014    Lipid Panel     Component Value Date/Time   CHOL 248 (H) 02/29/2024 1140   TRIG 101 02/29/2024 1140   HDL 58 02/29/2024 1140   CHOLHDL 4.3 02/29/2024 1140   LDLCALC 168 (H) 02/29/2024 1140    CBC    Component Value Date/Time   WBC 5.3 02/29/2024 1140   RBC 5.14 (H) 02/29/2024 1140   HGB 13.0 02/29/2024 1140   HGB 12.4 02/21/2024 1013   HCT 41.0 02/29/2024 1140   PLT 224 02/29/2024 1140   PLT 220 02/21/2024 1013   MCV 79.8 (L) 02/29/2024 1140   MCH 25.3 (L) 02/29/2024 1140   MCHC 31.7 (L) 02/29/2024 1140   RDW 14.2 02/29/2024 1140   LYMPHSABS 2.0 02/21/2024 1013   MONOABS 0.4 02/21/2024 1013   EOSABS 0.2 02/21/2024 1013   BASOSABS 0.1 02/21/2024 1013    Hgb A1C Lab Results  Component Value Date   HGBA1C 6.2 (H) 02/29/2024           Assessment & Plan:      RTC in 6 months for your annual exam Angeline Laura, NP

## 2024-08-29 ENCOUNTER — Encounter: Payer: Self-pay | Admitting: Oncology

## 2024-08-29 ENCOUNTER — Ambulatory Visit: Admitting: Internal Medicine

## 2024-08-29 ENCOUNTER — Inpatient Hospital Stay: Attending: Oncology

## 2024-08-29 ENCOUNTER — Ambulatory Visit: Admitting: Oncology

## 2024-08-29 VITALS — BP 160/94 | HR 90 | Temp 98.3°F | Resp 18 | Wt 286.5 lb

## 2024-08-29 DIAGNOSIS — K921 Melena: Secondary | ICD-10-CM | POA: Diagnosis not present

## 2024-08-29 DIAGNOSIS — E782 Mixed hyperlipidemia: Secondary | ICD-10-CM

## 2024-08-29 DIAGNOSIS — Z803 Family history of malignant neoplasm of breast: Secondary | ICD-10-CM | POA: Insufficient documentation

## 2024-08-29 DIAGNOSIS — Z86718 Personal history of other venous thrombosis and embolism: Secondary | ICD-10-CM | POA: Diagnosis not present

## 2024-08-29 DIAGNOSIS — M5441 Lumbago with sciatica, right side: Secondary | ICD-10-CM | POA: Diagnosis not present

## 2024-08-29 DIAGNOSIS — Z79899 Other long term (current) drug therapy: Secondary | ICD-10-CM | POA: Diagnosis not present

## 2024-08-29 DIAGNOSIS — Z7901 Long term (current) use of anticoagulants: Secondary | ICD-10-CM | POA: Insufficient documentation

## 2024-08-29 DIAGNOSIS — K649 Unspecified hemorrhoids: Secondary | ICD-10-CM | POA: Insufficient documentation

## 2024-08-29 DIAGNOSIS — Z87891 Personal history of nicotine dependence: Secondary | ICD-10-CM | POA: Insufficient documentation

## 2024-08-29 DIAGNOSIS — Z8719 Personal history of other diseases of the digestive system: Secondary | ICD-10-CM | POA: Diagnosis not present

## 2024-08-29 LAB — CBC WITH DIFFERENTIAL (CANCER CENTER ONLY)
Abs Immature Granulocytes: 0.02 K/uL (ref 0.00–0.07)
Basophils Absolute: 0 K/uL (ref 0.0–0.1)
Basophils Relative: 1 %
Eosinophils Absolute: 0.1 K/uL (ref 0.0–0.5)
Eosinophils Relative: 2 %
HCT: 38.7 % (ref 36.0–46.0)
Hemoglobin: 12.3 g/dL (ref 12.0–15.0)
Immature Granulocytes: 0 %
Lymphocytes Relative: 30 %
Lymphs Abs: 1.9 K/uL (ref 0.7–4.0)
MCH: 25.5 pg — ABNORMAL LOW (ref 26.0–34.0)
MCHC: 31.8 g/dL (ref 30.0–36.0)
MCV: 80.1 fL (ref 80.0–100.0)
Monocytes Absolute: 0.4 K/uL (ref 0.1–1.0)
Monocytes Relative: 7 %
Neutro Abs: 3.9 K/uL (ref 1.7–7.7)
Neutrophils Relative %: 60 %
Platelet Count: 232 K/uL (ref 150–400)
RBC: 4.83 MIL/uL (ref 3.87–5.11)
RDW: 15.6 % — ABNORMAL HIGH (ref 11.5–15.5)
WBC Count: 6.3 K/uL (ref 4.0–10.5)
nRBC: 0 % (ref 0.0–0.2)

## 2024-08-29 LAB — CMP (CANCER CENTER ONLY)
ALT: 35 U/L (ref 0–44)
AST: 26 U/L (ref 15–41)
Albumin: 4.4 g/dL (ref 3.5–5.0)
Alkaline Phosphatase: 63 U/L (ref 38–126)
Anion gap: 9 (ref 5–15)
BUN: 13 mg/dL (ref 6–20)
CO2: 29 mmol/L (ref 22–32)
Calcium: 9.5 mg/dL (ref 8.9–10.3)
Chloride: 102 mmol/L (ref 98–111)
Creatinine: 1.04 mg/dL — ABNORMAL HIGH (ref 0.44–1.00)
GFR, Estimated: >60 mL/min (ref >60)
Glucose, Bld: 111 mg/dL — ABNORMAL HIGH (ref 70–99)
Potassium: 3.9 mmol/L (ref 3.5–5.1)
Sodium: 140 mmol/L (ref 135–145)
Total Bilirubin: 0.4 mg/dL (ref 0.0–1.2)
Total Protein: 7.2 g/dL (ref 6.5–8.1)

## 2024-08-29 MED ORDER — APIXABAN 2.5 MG PO TABS: 2.5000 mg | ORAL_TABLET | Freq: Two times a day (BID) | ORAL | 1 refills | Status: DC

## 2024-08-29 MED ORDER — APIXABAN 2.5 MG PO TABS: 2.5000 mg | ORAL_TABLET | Freq: Two times a day (BID) | ORAL | 3 refills | Status: AC

## 2024-08-29 NOTE — Progress Notes (Unsigned)
 Subjective:    Patient ID: Stacey Townsend, female    DOB: 1975-09-23, 48 y.o.   MRN: [id number]  HPI  Patient presents the clinic today for 5-month follow-up of chronic conditions.  Asthma: Moderate, persistent.  She is taking ciclesonide and albuterol  as prescribed.  There are no PFTs on file.  She does not follow with pulmonology.  HTN: Her BP today is 124/84.  She is taking lisinopril  HCT as prescribed.  There is no ECG on file.  History of left DVT, family history of clotting disorder: Managed with apixaban .  She follows with hematology.  Chronic back/knee pain.  Managed with tylenol arthritis and baclofen  as prescribed. She has had an epidural steroid injection but does feel like this made her pain worse. She is currently undergoing PT.  She follows with neurosurgery.  HLD: Her last LDL was 168, triglycerides 101, 02/2024.  She denies myalgias on lovastatin .  She tries to consume a low-fat diet.  Prediabetes: Her last A1c was 6.2%, 02/2024.  She is not taking any oral diabetic medication this time.  She does not check her sugars.  Review of Systems     Past Medical History:  Diagnosis Date   Asthma    Family history of clotting disorder    History of DVT (deep vein thrombosis)    Hypertension    Osteoarthritis, knee    Supraventricular tachycardia, paroxysmal 2017    Current Outpatient Medications  Medication Sig Dispense Refill   acetaminophen (TYLENOL) 500 MG tablet Take 500 mg by mouth every 8 (eight) hours as needed.     albuterol  (VENTOLIN  HFA) 108 (90 Base) MCG/ACT inhaler Inhale 2 puffs into the lungs every 4 (four) hours as needed for wheezing or shortness of breath. 1 each 5   apixaban  (ELIQUIS ) 2.5 MG TABS tablet Take 1 tablet (2.5 mg total) by mouth 2 (two) times daily. 180 tablet 1   baclofen  (LIORESAL ) 10 MG tablet Take 1 tablet (10 mg total) by mouth daily as needed for muscle spasms. 90 tablet 1   betamethasone dipropionate 0.05 % lotion Apply  topically 2 (two) times daily.     cetirizine (ZYRTEC) 10 MG tablet Take 10 mg by mouth daily as needed.     ciclesonide (ALVESCO) 80 MCG/ACT inhaler Inhale 1 puff into the lungs 2 (two) times daily.     ketoconazole (NIZORAL) 2 % shampoo Apply 1 Application topically once.     lisinopril -hydrochlorothiazide  (ZESTORETIC ) 20-25 MG tablet TAKE ONE TABLET BY MOUTH ONE TIME DAILY 90 tablet 1   lovastatin  (MEVACOR ) 10 MG tablet TAKE ONE TABLET BY MOUTH AT BEDTIME 30 tablet 2   tacrolimus (PROTOPIC) 0.1 % ointment Apply 1 Application topically 2 (two) times daily.     triamcinolone (KENALOG) 0.025 % cream Apply 1 Application topically 2 (two) times daily.     No current facility-administered medications for this visit.    Allergies  Allergen Reactions   Penicillins Shortness Of Breath    Hives and vomitting    Family History  Problem Relation Age of Onset   Deep vein thrombosis Mother    Thyroid disease Mother    Hypertension Father    Diabetes Father    Deep vein thrombosis Sister    Thyroid disease Sister    Hypertension Sister    Healthy Sister    Deep vein thrombosis Maternal Uncle    Deep vein thrombosis Paternal Aunt    Breast cancer Paternal Aunt 5  gene+? (half aunt)   Breast cancer Paternal Aunt 21       half aunt   Deep vein thrombosis Paternal Uncle    Breast cancer Paternal Grandmother 68    Social History   Socioeconomic History   Marital status: Single    Spouse name: Not on file   Number of children: Not on file   Years of education: Not on file   Highest education level: Not on file  Occupational History   Not on file  Tobacco Use   Smoking status: Never   Smokeless tobacco: Never  Vaping Use   Vaping status: Former   Devices: only 2-3 months  Substance and Sexual Activity   Alcohol use: Yes    Alcohol/week: 7.0 standard drinks of alcohol    Types: 7 Standard drinks or equivalent per week   Drug use: Never   Sexual activity: Yes  Other  Topics Concern   Not on file  Social History Narrative   Not on file   Social Drivers of Health   Tobacco Use: Low Risk (07/18/2024)   Patient History    Smoking Tobacco Use: Never    Smokeless Tobacco Use: Never    Passive Exposure: Not on file  Financial Resource Strain: Low Risk  (06/06/2024)   Received from [facility]   Overall Financial Resource Strain (CARDIA)    Difficulty of Paying Living Expenses: Not very hard  Food Insecurity: No Food Insecurity (06/06/2024)   Received from [facility]   Epic    Within the past 12 months, you worried that your food would run out before you got the money to buy more.: Never true    Within the past 12 months, the food you bought just didn't last and you didn't have money to get more.: Never true  Transportation Needs: No Transportation Needs (06/06/2024)   Received from [facility] - Transportation    In the past 12 months, has lack of transportation kept you from medical appointments or from getting medications?: No    Lack of Transportation (Non-Medical): No  Physical Activity: Not on file  Stress: Not on file  Social Connections: Not on file  Intimate Partner Violence: Not on file  Depression (PHQ2-9): Low Risk (02/29/2024)   Depression (PHQ2-9)    PHQ-2 Score: 1  Alcohol Screen: Low Risk (09/16/2022)   Alcohol Screen    Last Alcohol Screening Score (AUDIT): 5  Housing: Low Risk  (06/06/2024)   Received from [facility]   Epic    In the last 12 months, was there a time when you were not able to pay the mortgage or rent on time?: No    In the past 12 months, how many times have you moved where you were living?: 0    At any time in the past 12 months, were you homeless or living in a shelter (including now)?: No  Utilities: Not At Risk (06/06/2024)   Received from [facility]   Epic    In the past 12 months has the electric, gas, oil, or  water company threatened to shut off services in your home?: No  Health Literacy: Not on file     Constitutional: Denies fever, malaise, fatigue, headache or abrupt weight changes.  HEENT: Denies eye pain, eye redness, ear pain, ringing in the ears, wax buildup, runny nose, nasal congestion, bloody nose, or sore throat. Respiratory: Denies difficulty breathing, shortness  of breath, cough or sputum production.   Cardiovascular: Patient reports intermittent swelling in legs.  Denies chest pain, chest tightness, palpitations or swelling in the hands.  Gastrointestinal: Denies abdominal pain, bloating, constipation, diarrhea or blood in the stool.  GU: Denies urgency, frequency, pain with urination, burning sensation, blood in urine, odor or discharge. Musculoskeletal: Patient reports back/knee pain.  Denies decrease in range of motion, difficulty with gait, muscle pain or joint swelling.  Skin: Denies redness, rashes, lesions or ulcercations.  Neurological: Denies dizziness, difficulty with memory, difficulty with speech or problems with balance and coordination.  Psych: Denies anxiety, depression, SI/HI.  No other specific complaints in a complete review of systems (except as listed in HPI above).  Objective:   Physical Exam  There were no vitals taken for this visit.  Wt Readings from Last 3 Encounters:  07/18/24 288 lb 8 oz (130.9 kg)  02/29/24 279 lb (126.6 kg)  02/29/24 279 lb 9.6 oz (126.8 kg)    General: Appears her stated age, obese in NAD. Skin: Warm, dry and intact.  HEENT: Head: normal shape and size; Eyes: sclera white, no icterus, conjunctiva pink, PERRLA and EOMs intact;  Cardiovascular: Normal rate and rhythm. S1,S2 noted.  No murmur, rubs or gallops noted. No JVD or BLE edema.  Pulmonary/Chest: Normal effort and positive vesicular breath sounds. No respiratory distress. No wheezes, rales or ronchi noted.  Musculoskeletal: No difficulty with gait.  Neurological: Alert  and oriented. Coordination normal.  Psychiatric: Mood and affect normal. Behavior is normal. Judgment and thought content normal.    BMET    Component Value Date/Time   NA 139 02/29/2024 1140   K 4.0 02/29/2024 1140   CL 102 02/29/2024 1140   CO2 28 02/29/2024 1140   GLUCOSE 88 02/29/2024 1140   BUN 15 02/29/2024 1140   CREATININE 0.84 02/29/2024 1140   CALCIUM 9.1 02/29/2024 1140   GFRNONAA >60 02/21/2024 1014    Lipid Panel     Component Value Date/Time   CHOL 248 (H) 02/29/2024 1140   TRIG 101 02/29/2024 1140   HDL 58 02/29/2024 1140   CHOLHDL 4.3 02/29/2024 1140   LDLCALC 168 (H) 02/29/2024 1140    CBC    Component Value Date/Time   WBC 5.3 02/29/2024 1140   RBC 5.14 (H) 02/29/2024 1140   HGB 13.0 02/29/2024 1140   HGB 12.4 02/21/2024 1013   HCT 41.0 02/29/2024 1140   PLT 224 02/29/2024 1140   PLT 220 02/21/2024 1013   MCV 79.8 (L) 02/29/2024 1140   MCH 25.3 (L) 02/29/2024 1140   MCHC 31.7 (L) 02/29/2024 1140   RDW 14.2 02/29/2024 1140   LYMPHSABS 2.0 02/21/2024 1013   MONOABS 0.4 02/21/2024 1013   EOSABS 0.2 02/21/2024 1013   BASOSABS 0.1 02/21/2024 1013    Hgb A1C Lab Results  Component Value Date   HGBA1C 6.2 (H) 02/29/2024           Assessment & Plan:      RTC in 6 months for your annual exam Angeline Laura, NP

## 2024-08-29 NOTE — Assessment & Plan Note (Addendum)
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 Negative anticardiolipin IgG/IgM.  10/07/2021 positive lupus anticoagulant 02/04/2022, repeat testing showed negative lupus anticoagulant.  Not meeting APS diagnosis criteria Jan 2025 repeat US  LE, no DVT  continue Eliquis  2.5mg  BID for long-term anticoagulation prophylaxis.

## 2024-08-29 NOTE — Progress Notes (Signed)
 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 Negative anticardiolipin IgG/IgM.  10/07/2021 positive lupus anticoagulant 02/04/2022, repeat testing showed negative lupus anticoagulant.  Not meeting APS diagnosis criteria Jan 2025 repeat US  LE, no DVT  continue Eliquis  2.5mg  BID for long-term anticoagulation prophylaxis.    Blood in stool History of hemorrhoids Recommend patient to follow up with GI. Refer to Dr. Therisa.     Orders Placed This Encounter  Procedures   CMP (Cancer Center only)    Standing Status:   Future    Expected Date:   08/29/2025    Expiration Date:   11/27/2025   CBC with Differential (Cancer Center Only)    Standing Status:   Future    Expected Date:   08/29/2025    Expiration Date:   11/27/2025   Ambulatory referral to Gastroenterology    Referral Priority:   Routine    Referral Type:   Consultation    Referral Reason:   Specialty Services Required    Number of Visits Requested:   1   Follow up in 12 months.  All questions were answered. The patient knows to call the clinic with any problems, questions or concerns.  Call Babara, MD, PhD [facility] Hematology Oncology 08/29/2024   CHIEF COMPLAINTS/REASON FOR VISIT:   history of DVT  HISTORY OF PRESENTING ILLNESS:   Stacey Townsend is a  48 y.o.  female with PMH listed below was seen in consultation at the request of  Antonette Angeline ORN, NP  for evaluation of history of DVT  12/02/2020, patient presented to urgent care for evaluation of left lower extremity pain.  Was diagnosed with isolated calf DVT involving one of the  posterior tibial and peroneal veins.  Patient was started on Lovenox bridged Pradaxa  150 mg twice daily.  Patient tolerates anticoagulation.  There was no immobilization factors prior to her symptom presentation. She had COVID 19 infection in January 2022.  Patient moved to [place]  from [place]  in July 2022.  She has establish care with primary care provider and was referred to establish care with hematology for management of history of DVT and anticoagulation.  Patient reports significant family history of blood clots, mother, sister, uncle. Patient denies smoking. Family history positive for breast cancer, paternal aunt and maternal grandmother.  INTERVAL HISTORY Stacey Townsend is a 48 y.o. female who has above history reviewed by me today presents for follow up visit for management of history of DVT Patient is currently taking Eliquis  2.5mg  BID,   She experiences intermittent hematochezia, characterized by bright red blood per rectum occurring two to three times per week, without associated pain or alteration in bowel habits. Hemorrhoids are present and may be contributing to the bleeding.  She remains on Eliquis  for deep vein thrombosis and generally tolerates therapy well, though she missed one dose on the morning of the visit. She denies new thrombotic events or significant bleeding complications.   Review of Systems  Constitutional:  Negative for appetite change, chills, fatigue and fever.  HENT:   Negative for hearing loss and voice change.   Eyes:  Negative for eye problems.  Respiratory:  Negative for chest tightness and cough.   Cardiovascular:  Negative for chest pain.  Gastrointestinal:  Positive for blood in stool. Negative for abdominal distention and abdominal pain.  Endocrine: Negative for hot flashes.  Genitourinary:  Negative for difficulty urinating and frequency.   Musculoskeletal:  Negative for arthralgias.  Skin:  Negative for itching and rash.   Neurological:  Negative for extremity weakness.  Hematological:  Negative for adenopathy.  Psychiatric/Behavioral:  Negative for confusion.     MEDICAL HISTORY:  Past Medical History:  Diagnosis Date   Asthma    Family history of clotting disorder    History of DVT (deep vein thrombosis)    Hypertension    Osteoarthritis, knee    Supraventricular tachycardia, paroxysmal 2017    SURGICAL HISTORY: Past Surgical History:  Procedure Laterality Date   COLONOSCOPY WITH PROPOFOL  N/A 11/04/2021   Procedure: COLONOSCOPY WITH PROPOFOL ;  Surgeon: Therisa Bi, MD;  Location: [facility] ENDOSCOPY;  Service: Gastroenterology;  Laterality: N/A;   DILATION AND CURETTAGE OF UTERUS  2013   endometerial ablation  2016   HYSTEROSCOPY  2013    SOCIAL HISTORY: Social History   Socioeconomic History   Marital status: Single    Spouse name: Not on file   Number of children: Not on file   Years of education: Not on file   Highest education level: Not on file  Occupational History   Not on file  Tobacco Use   Smoking status: Never   Smokeless tobacco: Never  Vaping Use   Vaping status: Former   Devices: only 2-3 months  Substance and Sexual Activity   Alcohol use: Yes    Alcohol/week: 7.0 standard drinks of alcohol    Types: 7 Standard drinks or equivalent per week   Drug use: Never   Sexual activity: Yes  Other Topics Concern   Not on file  Social History Narrative   Not on file   Social Drivers of Health   Tobacco Use: Low Risk (08/29/2024)   Patient History    Smoking Tobacco Use: Never    Smokeless Tobacco Use: Never    Passive Exposure: Not on file  Financial Resource Strain: Low Risk  (06/06/2024)   Received from [facility]   Overall Financial Resource Strain (CARDIA)    Difficulty of Paying Living Expenses: Not very hard  Food Insecurity: No Food Insecurity (06/06/2024)   Received from [facility]   Epic    Within the past 12 months, you  worried that your food would run out before you got the money to buy more.: Never true    Within the past 12 months, the food you bought just didn't last and you didn't have money to get more.: Never true  Transportation Needs: No Transportation Needs (06/06/2024)   Received from [facility] - Transportation    In the past 12 months, has lack of transportation kept you from medical appointments or from getting medications?: No    Lack of Transportation (Non-Medical): No  Physical Activity: Not on file  Stress: Not on file  Social Connections: Not on file  Intimate Partner Violence: Not on file  Depression (PHQ2-9): Low Risk (02/29/2024)   Depression (PHQ2-9)    PHQ-2 Score: 1  Alcohol Screen: Low Risk (09/16/2022)   Alcohol Screen    Last Alcohol Screening Score (AUDIT): 5  Housing: Low Risk  (06/06/2024)   Received from [facility]  In the last 12 months, was there a time when you were not able to pay the mortgage or rent on time?: No    In the past 12 months, how many times have you moved where you were living?: 0    At any time in the past 12 months, were you homeless or living in a shelter (including now)?: No  Utilities: Not At Risk (06/06/2024)   Received from [facility]   Epic    In the past 12 months has the electric, gas, oil, or water company threatened to shut off services in your home?: No  Health Literacy: Not on file    FAMILY HISTORY: Family History  Problem Relation Age of Onset   Deep vein thrombosis Mother    Thyroid disease Mother    Hypertension Father    Diabetes Father    Deep vein thrombosis Sister    Thyroid disease Sister    Hypertension Sister    Healthy Sister    Deep vein thrombosis Maternal Uncle    Deep vein thrombosis Paternal Aunt    Breast cancer Paternal Aunt 40       gene+? (half aunt)   Breast cancer Paternal Aunt 33       half aunt   Deep vein thrombosis Paternal  Uncle    Breast cancer Paternal Grandmother 68    ALLERGIES:  is allergic to penicillins.  MEDICATIONS:  Current Outpatient Medications  Medication Sig Dispense Refill   acetaminophen (TYLENOL) 500 MG tablet Take 500 mg by mouth every 8 (eight) hours as needed.     albuterol  (VENTOLIN  HFA) 108 (90 Base) MCG/ACT inhaler Inhale 2 puffs into the lungs every 4 (four) hours as needed for wheezing or shortness of breath. 1 each 5   baclofen  (LIORESAL ) 10 MG tablet Take 1 tablet (10 mg total) by mouth daily as needed for muscle spasms. 90 tablet 1   betamethasone dipropionate 0.05 % lotion Apply topically 2 (two) times daily.     cetirizine (ZYRTEC) 10 MG tablet Take 10 mg by mouth daily as needed.     ciclesonide  (ALVESCO ) 80 MCG/ACT inhaler Inhale 1 puff into the lungs 2 (two) times daily.     ketoconazole (NIZORAL) 2 % shampoo Apply 1 Application topically once.     lisinopril -hydrochlorothiazide  (ZESTORETIC ) 20-25 MG tablet TAKE ONE TABLET BY MOUTH ONE TIME DAILY 90 tablet 1   lovastatin  (MEVACOR ) 10 MG tablet TAKE ONE TABLET BY MOUTH AT BEDTIME 30 tablet 2   tacrolimus (PROTOPIC) 0.1 % ointment Apply 1 Application topically 2 (two) times daily.     triamcinolone (KENALOG) 0.025 % cream Apply 1 Application topically 2 (two) times daily.     apixaban  (ELIQUIS ) 2.5 MG TABS tablet Take 1 tablet (2.5 mg total) by mouth 2 (two) times daily. 180 tablet 3   No current facility-administered medications for this visit.     PHYSICAL EXAMINATION: ECOG PERFORMANCE STATUS: 0 - Asymptomatic Vitals:   08/29/24 1334 08/29/24 1344  BP: (!) 149/93 (!) 160/94  Pulse: 90   Resp: 18   Temp: 98.3 F (36.8 C)   SpO2: 99%    Filed Weights   08/29/24 1334  Weight: 286 lb 8 oz (130 kg)    Physical Exam Constitutional:      General: She is not in acute distress. HENT:     Head: Normocephalic and atraumatic.  Eyes:     General: No scleral icterus. Cardiovascular:     Rate and Rhythm:  Normal rate  and regular rhythm.     Heart sounds: Normal heart sounds.  Pulmonary:     Effort: Pulmonary effort is normal. No respiratory distress.     Breath sounds: No wheezing.  Abdominal:     General: Bowel sounds are normal. There is no distension.     Palpations: Abdomen is soft.  Musculoskeletal:        General: No deformity. Normal range of motion.     Cervical back: Normal range of motion and neck supple.     Right lower leg: No edema.     Left lower leg: No edema.  Skin:    General: Skin is warm and dry.     Findings: No erythema or rash.  Neurological:     Mental Status: She is alert and oriented to person, place, and time. Mental status is at baseline.     Cranial Nerves: No cranial nerve deficit.     Coordination: Coordination normal.  Psychiatric:        Mood and Affect: Mood normal.     LABORATORY DATA:  I have reviewed the data as listed    Latest Ref Rng & Units 08/29/2024    1:23 PM 02/29/2024   11:40 AM 02/21/2024   10:13 AM  CBC  WBC 4.0 - 10.5 K/uL 6.3  5.3  6.2   Hemoglobin 12.0 - 15.0 g/dL 87.6  86.9  87.5   Hematocrit 36.0 - 46.0 % 38.7  41.0  38.9   Platelets 150 - 400 K/uL 232  224  220       Latest Ref Rng & Units 08/29/2024    1:22 PM 02/29/2024   11:40 AM 02/21/2024   10:14 AM  CMP  Glucose 70 - 99 mg/dL 888  88  892   BUN 6 - 20 mg/dL 13  15  16    Creatinine 0.44 - 1.00 mg/dL 8.95  9.15  9.11   Sodium 135 - 145 mmol/L 140  139  137   Potassium 3.5 - 5.1 mmol/L 3.9  4.0  4.1   Chloride 98 - 111 mmol/L 102  102  102   CO2 22 - 32 mmol/L 29  28  29    Calcium 8.9 - 10.3 mg/dL 9.5  9.1  8.8   Total Protein 6.5 - 8.1 g/dL 7.2  6.8  6.9   Total Bilirubin 0.0 - 1.2 mg/dL 0.4  0.4  0.6   Alkaline Phos 38 - 126 U/L 63   50   AST 15 - 41 U/L 26  14  17    ALT 0 - 44 U/L 35  14  21    Iron/TIBC/Ferritin/ %Sat No results found for: IRON, TIBC, FERRITIN, IRONPCTSAT    RADIOGRAPHIC STUDIES: I have personally reviewed the radiological images as  listed and agreed with the findings in the report. No results found.

## 2024-08-29 NOTE — Assessment & Plan Note (Signed)
 History of hemorrhoids Recommend patient to follow up with GI. Refer to Dr. Therisa.

## 2024-08-30 ENCOUNTER — Encounter: Payer: Self-pay | Admitting: Internal Medicine

## 2024-08-30 ENCOUNTER — Ambulatory Visit: Admitting: Internal Medicine

## 2024-08-30 ENCOUNTER — Telehealth: Payer: Self-pay

## 2024-08-30 VITALS — BP 150/90 | Ht 67.5 in | Wt 283.8 lb

## 2024-08-30 DIAGNOSIS — I471 Supraventricular tachycardia, unspecified: Secondary | ICD-10-CM

## 2024-08-30 DIAGNOSIS — R7303 Prediabetes: Secondary | ICD-10-CM

## 2024-08-30 DIAGNOSIS — I10 Essential (primary) hypertension: Secondary | ICD-10-CM

## 2024-08-30 DIAGNOSIS — L309 Dermatitis, unspecified: Secondary | ICD-10-CM | POA: Insufficient documentation

## 2024-08-30 DIAGNOSIS — Z86718 Personal history of other venous thrombosis and embolism: Secondary | ICD-10-CM | POA: Diagnosis not present

## 2024-08-30 DIAGNOSIS — M25562 Pain in left knee: Secondary | ICD-10-CM | POA: Diagnosis not present

## 2024-08-30 DIAGNOSIS — J4540 Moderate persistent asthma, uncomplicated: Secondary | ICD-10-CM

## 2024-08-30 DIAGNOSIS — G8929 Other chronic pain: Secondary | ICD-10-CM | POA: Diagnosis not present

## 2024-08-30 DIAGNOSIS — M5441 Lumbago with sciatica, right side: Secondary | ICD-10-CM

## 2024-08-30 DIAGNOSIS — E7800 Pure hypercholesterolemia, unspecified: Secondary | ICD-10-CM | POA: Diagnosis not present

## 2024-08-30 DIAGNOSIS — L308 Other specified dermatitis: Secondary | ICD-10-CM

## 2024-08-30 MED ORDER — CICLESONIDE 80 MCG/ACT IN AERS: 1.0000 | INHALATION_SPRAY | Freq: Two times a day (BID) | RESPIRATORY_TRACT | 5 refills | Status: AC

## 2024-08-30 MED ORDER — ALBUTEROL SULFATE HFA 108 (90 BASE) MCG/ACT IN AERS: 2.0000 | INHALATION_SPRAY | RESPIRATORY_TRACT | 5 refills | Status: AC | PRN

## 2024-08-30 NOTE — Telephone Encounter (Signed)
 I have reached out to the patient to see if she is okay with an alternative

## 2024-08-30 NOTE — Assessment & Plan Note (Signed)
 Complicated by morbid obesity Controlled on lisinopril  HCT 20-25 mg daily, advised her to take this consistently to reduce the risk of heart attack, stroke, dementia and kidney failure Reinforced diet and exercise for weight loss C-Met today

## 2024-08-30 NOTE — Assessment & Plan Note (Signed)
 Complicated by morbid obesity Encouraged regular stretching and core strengthening Encourage weight loss as this can help reduce back pain Continue tylenol OTC and baclofen  10 mg daily as needed She will continue to follow with neurosurgery

## 2024-08-30 NOTE — Assessment & Plan Note (Signed)
 Complicated by morbid obesity A1c today Encourage low-carb diet and exercise for weight loss

## 2024-08-30 NOTE — Assessment & Plan Note (Signed)
 Complicated by morbid obesity Encourage weight loss as this can help reduce joint pain Continue baclofen  10 mg daily as needed and tylenol OTC as needed

## 2024-08-30 NOTE — Assessment & Plan Note (Signed)
 Continue apixaban  2.5 mg twice daily She will continue to follow with hematology

## 2024-08-30 NOTE — Telephone Encounter (Signed)
 GI referral faxed to [facility]. RE: re-establish care for IDA.   Fax confirmation received.

## 2024-08-30 NOTE — Assessment & Plan Note (Signed)
 Continue ciclesonide  80 mcg per actuation twice daily and albuterol  108 mcg per actuation every 4-6 hours as needed We will monitor

## 2024-08-30 NOTE — Assessment & Plan Note (Signed)
 Continue ketoconazole 2% shampoo, tacrolimus 0.1% ointment twice daily as needed and triamcinolone 0.025% twice daily as needed She will continue to follow with dermatology

## 2024-08-30 NOTE — Patient Instructions (Signed)

## 2024-08-30 NOTE — Assessment & Plan Note (Signed)
 Complicated by morbid obesity  C-Met and lipid profile today Encouraged her to consume a low-fat diet Continue lovastatin  10 mg daily, will adjust if needed to obtain LDL <100

## 2024-08-30 NOTE — Assessment & Plan Note (Signed)
Not currently on beta-blocker We will monitor

## 2024-08-30 NOTE — Telephone Encounter (Signed)
 Copied from CRM #[id number]. Topic: Clinical - Prescription Issue >> Aug 30, 2024 10:32 AM Tinnie BROCKS wrote: Reason for CRM: The inhaler that was sent in was not covered by insurance per [facility][id number]. Representative states her insurance will cover either of these similar inhalers: qvar readihale arnuity ellipta inhaler  [facility] [address] [facility] - [place], [place] - [address] AT [address] [place] [address] Phone: [phone] Fax: [phone]

## 2024-08-30 NOTE — Assessment & Plan Note (Signed)
 Encourage diet and exercise for weight loss

## 2024-08-31 LAB — COMPREHENSIVE METABOLIC PANEL WITH GFR
AG Ratio: 1.7 (calc) (ref 1.0–2.5)
ALT: 26 U/L (ref 6–29)
AST: 17 U/L (ref 10–35)
Albumin: 4.4 g/dL (ref 3.6–5.1)
Alkaline phosphatase (APISO): 58 U/L (ref 31–125)
BUN: 15 mg/dL (ref 7–25)
CO2: 30 mmol/L (ref 20–32)
Calcium: 9.2 mg/dL (ref 8.6–10.2)
Chloride: 101 mmol/L (ref 98–110)
Creat: 0.97 mg/dL (ref 0.50–0.99)
Globulin: 2.6 g/dL (ref 1.9–3.7)
Glucose, Bld: 89 mg/dL (ref 65–99)
Potassium: 3.9 mmol/L (ref 3.5–5.3)
Sodium: 139 mmol/L (ref 135–146)
Total Bilirubin: 0.5 mg/dL (ref 0.2–1.2)
Total Protein: 7 g/dL (ref 6.1–8.1)
eGFR: 72 mL/min/1.73m2

## 2024-08-31 LAB — HEMOGLOBIN A1C
Hgb A1c MFr Bld: 6 % — ABNORMAL HIGH
Mean Plasma Glucose: 126 mg/dL
eAG (mmol/L): 7 mmol/L

## 2024-08-31 LAB — LIPID PANEL
Cholesterol: 252 mg/dL — ABNORMAL HIGH
HDL: 56 mg/dL
LDL Cholesterol (Calc): 174 mg/dL — ABNORMAL HIGH
Non-HDL Cholesterol (Calc): 196 mg/dL — ABNORMAL HIGH
Total CHOL/HDL Ratio: 4.5 (calc)
Triglycerides: 101 mg/dL

## 2024-08-31 LAB — CBC
HCT: 39.3 % (ref 35.9–46.0)
Hemoglobin: 12.4 g/dL (ref 11.7–15.5)
MCH: 24.9 pg — ABNORMAL LOW (ref 27.0–33.0)
MCHC: 31.6 g/dL (ref 31.6–35.4)
MCV: 78.9 fL — ABNORMAL LOW (ref 81.4–101.7)
MPV: 11.8 fL (ref 7.5–12.5)
Platelets: 231 Thousand/uL (ref 140–400)
RBC: 4.98 Million/uL (ref 3.80–5.10)
RDW: 14.7 % (ref 11.0–15.0)
WBC: 5.5 Thousand/uL (ref 3.8–10.8)

## 2024-09-02 ENCOUNTER — Ambulatory Visit: Payer: Self-pay | Admitting: Internal Medicine

## 2024-09-18 NOTE — Telephone Encounter (Signed)
 Pt scheduled on 10/07/24

## 2024-09-27 ENCOUNTER — Ambulatory Visit: Admitting: Internal Medicine

## 2024-09-27 ENCOUNTER — Encounter: Payer: Self-pay | Admitting: Internal Medicine

## 2024-09-27 VITALS — BP 138/86 | Ht 67.5 in | Wt 283.0 lb

## 2024-09-27 DIAGNOSIS — I10 Essential (primary) hypertension: Secondary | ICD-10-CM

## 2024-09-27 MED ORDER — LISINOPRIL-HYDROCHLOROTHIAZIDE 20-25 MG PO TABS: 2.0000 | ORAL_TABLET | Freq: Every day | ORAL | 1 refills | Status: AC

## 2024-09-27 NOTE — Patient Instructions (Signed)
 Hypertension, Adult Hypertension is another name for high blood pressure. High blood pressure forces your heart to work harder to pump blood. This can cause problems over time. There are two numbers in a blood pressure reading. There is a top number (systolic) over a bottom number (diastolic). It is best to have a blood pressure that is below 120/80. What are the causes? The cause of this condition is not known. Some other conditions can lead to high blood pressure. What increases the risk? Some lifestyle factors can make you more likely to develop high blood pressure: Smoking. Not getting enough exercise or physical activity. Being overweight. Having too much fat, sugar, calories, or salt (sodium) in your diet. Drinking too much alcohol. Other risk factors include: Having any of these conditions: Heart disease. Diabetes. High cholesterol. Kidney disease. Obstructive sleep apnea. Having a family history of high blood pressure and high cholesterol. Age. The risk increases with age. Stress. What are the signs or symptoms? High blood pressure may not cause symptoms. Very high blood pressure (hypertensive crisis) may cause: Headache. Fast or uneven heartbeats (palpitations). Shortness of breath. Nosebleed. Vomiting or feeling like you may vomit (nauseous). Changes in how you see. Very bad chest pain. Feeling dizzy. Seizures. How is this treated? This condition is treated by making healthy lifestyle changes, such as: Eating healthy foods. Exercising more. Drinking less alcohol. Your doctor may prescribe medicine if lifestyle changes do not help enough and if: Your top number is above 130. Your bottom number is above 80. Your personal target blood pressure may vary. Follow these instructions at home: Eating and drinking  If told, follow the DASH eating plan. To follow this plan: Fill one half of your plate at each meal with fruits and vegetables. Fill one fourth of your plate  at each meal with whole grains. Whole grains include whole-wheat pasta, brown rice, and whole-grain bread. Eat or drink low-fat dairy products, such as skim milk or low-fat yogurt. Fill one fourth of your plate at each meal with low-fat (lean) proteins. Low-fat proteins include fish, chicken without skin, eggs, beans, and tofu. Avoid fatty meat, cured and processed meat, or chicken with skin. Avoid pre-made or processed food. Limit the amount of salt in your diet to less than 1,500 mg each day. Do not drink alcohol if: Your doctor tells you not to drink. You are pregnant, may be pregnant, or are planning to become pregnant. If you drink alcohol: Limit how much you have to: 0-1 drink a day for women. 0-2 drinks a day for men. Know how much alcohol is in your drink. In the [place] one drink equals one 12 oz bottle of beer (355 mL), one 5 oz glass of wine (148 mL), or one 1 oz glass of hard liquor (44 mL). Lifestyle  Work with your doctor to stay at a healthy weight or to lose weight. Ask your doctor what the best weight is for you. Get at least 30 minutes of exercise that causes your heart to beat faster (aerobic exercise) most days of the week. This may include walking, swimming, or biking. Get at least 30 minutes of exercise that strengthens your muscles (resistance exercise) at least 3 days a week. This may include lifting weights or doing Pilates. Do not smoke or use any products that contain nicotine or tobacco. If you need help quitting, ask your doctor. Check your blood pressure at home as told by your doctor. Keep all follow-up visits. Medicines Take over-the-counter and prescription medicines  only as told by your doctor. Follow directions carefully. Do not skip doses of blood pressure medicine. The medicine does not work as well if you skip doses. Skipping doses also puts you at risk for problems. Ask your doctor about side effects or reactions to medicines that you should watch  for. Contact a doctor if: You think you are having a reaction to the medicine you are taking. You have headaches that keep coming back. You feel dizzy. You have swelling in your ankles. You have trouble with your vision. Get help right away if: You get a very bad headache. You start to feel mixed up (confused). You feel weak or numb. You feel faint. You have very bad pain in your: Chest. Belly (abdomen). You vomit more than once. You have trouble breathing. These symptoms may be an emergency. Get help right away. Call 911. Do not wait to see if the symptoms will go away. Do not drive yourself to the hospital. Summary Hypertension is another name for high blood pressure. High blood pressure forces your heart to work harder to pump blood. For most people, a normal blood pressure is less than 120/80. Making healthy choices can help lower blood pressure. If your blood pressure does not get lower with healthy choices, you may need to take medicine. This information is not intended to replace advice given to you by your health care provider. Make sure you discuss any questions you have with your health care provider. Document Revised: 06/17/2021 Document Reviewed: 06/17/2021 Elsevier Patient Education  2024 [employer].

## 2024-09-27 NOTE — Progress Notes (Signed)
 "  Subjective:    Patient ID: Stacey Townsend, female    DOB: 19-Aug-1976, 49 y.o.   MRN: [id number]  HPI  Discussed the use of AI scribe software for clinical note transcription with the patient, who gave verbal consent to proceed.  Stacey Townsend is a 49 year old female with hypertension complicated by morbid obesity who presents for follow-up on her blood pressure management.  She has been inconsistent with taking her blood pressure medication in the past but has been taking it on time every day for the past month. She is currently taking lisinopril  HCT 20-25 mg, one tablet daily.  Her blood pressure was 150/90 mmHg at the last visit and is 140/88 mmHg today.       Review of Systems     Past Medical History:  Diagnosis Date   Asthma    Family history of clotting disorder    History of DVT (deep vein thrombosis)    Hypertension    Osteoarthritis, knee    Supraventricular tachycardia, paroxysmal 2017    Current Outpatient Medications  Medication Sig Dispense Refill   acetaminophen (TYLENOL) 500 MG tablet Take 500 mg by mouth every 8 (eight) hours as needed.     albuterol  (VENTOLIN  HFA) 108 (90 Base) MCG/ACT inhaler Inhale 2 puffs into the lungs every 4 (four) hours as needed for wheezing or shortness of breath. 1 each 5   apixaban  (ELIQUIS ) 2.5 MG TABS tablet Take 1 tablet (2.5 mg total) by mouth 2 (two) times daily. 180 tablet 1   baclofen  (LIORESAL ) 10 MG tablet Take 1 tablet (10 mg total) by mouth daily as needed for muscle spasms. 90 tablet 1   betamethasone dipropionate 0.05 % lotion Apply topically 2 (two) times daily.     cetirizine (ZYRTEC) 10 MG tablet Take 10 mg by mouth daily as needed.     ciclesonide  (ALVESCO ) 80 MCG/ACT inhaler Inhale 1 puff into the lungs 2 (two) times daily.     ketoconazole (NIZORAL) 2 % shampoo Apply 1 Application topically once.     lisinopril -hydrochlorothiazide  (ZESTORETIC ) 20-25 MG tablet TAKE ONE TABLET BY MOUTH ONE TIME DAILY  90 tablet 1   lovastatin  (MEVACOR ) 10 MG tablet TAKE ONE TABLET BY MOUTH AT BEDTIME 30 tablet 2   tacrolimus (PROTOPIC) 0.1 % ointment Apply 1 Application topically 2 (two) times daily.     triamcinolone (KENALOG) 0.025 % cream Apply 1 Application topically 2 (two) times daily.     No current facility-administered medications for this visit.    Allergies  Allergen Reactions   Penicillins Shortness Of Breath    Hives and vomitting    Family History  Problem Relation Age of Onset   Deep vein thrombosis Mother    Thyroid disease Mother    Hypertension Father    Diabetes Father    Deep vein thrombosis Sister    Thyroid disease Sister    Hypertension Sister    Healthy Sister    Deep vein thrombosis Maternal Uncle    Deep vein thrombosis Paternal Aunt    Breast cancer Paternal Aunt 40       gene+? (half aunt)   Breast cancer Paternal Aunt 18       half aunt   Deep vein thrombosis Paternal Uncle    Breast cancer Paternal Grandmother 61    Social History   Socioeconomic History   Marital status: Single    Spouse name: Not on file   Number of children: Not on  file   Years of education: Not on file   Highest education level: Not on file  Occupational History   Not on file  Tobacco Use   Smoking status: Never   Smokeless tobacco: Never  Vaping Use   Vaping status: Former   Devices: only 2-3 months  Substance and Sexual Activity   Alcohol use: Yes    Alcohol/week: 7.0 standard drinks of alcohol    Types: 7 Standard drinks or equivalent per week   Drug use: Never   Sexual activity: Yes  Other Topics Concern   Not on file  Social History Narrative   Not on file   Social Drivers of Health   Tobacco Use: Low Risk (07/18/2024)   Patient History    Smoking Tobacco Use: Never    Smokeless Tobacco Use: Never    Passive Exposure: Not on file  Financial Resource Strain: Low Risk  (06/06/2024)   Received from [facility]   Overall Financial Resource  Strain (CARDIA)    Difficulty of Paying Living Expenses: Not very hard  Food Insecurity: No Food Insecurity (06/06/2024)   Received [facility]   Epic    Within the past 12 months, you worried that your food would run out before you got the money to buy more.: Never true    Within the past 12 months, the food you bought just didn't last and you didn't have money to get more.: Never true  Transportation Needs: No Transportation Needs (06/06/2024)   Received from [facility] - Transportation    In the past 12 months, has lack of transportation kept you from medical appointments or from getting medications?: No    Lack of Transportation (Non-Medical): No  Physical Activity: Not on file  Stress: Not on file  Social Connections: Not on file  Intimate Partner Violence: Not on file  Depression (PHQ2-9): Low Risk (02/29/2024)   Depression (PHQ2-9)    PHQ-2 Score: 1  Alcohol Screen: Low Risk (09/16/2022)   Alcohol Screen    Last Alcohol Screening Score (AUDIT): 5  Housing: Low Risk  (06/06/2024)   Received from [facility]   Epic    In the last 12 months, was there a time when you were not able to pay the mortgage or rent on time?: No    In the past 12 months, how many times have you moved where you were living?: 0    At any time in the past 12 months, were you homeless or living in a shelter (including now)?: No  Utilities: Not At Risk (06/06/2024)   Received from [facility]   Epic    In the past 12 months has the electric, gas, oil, or water company threatened to shut off services in your home?: No  Health Literacy: Not on file     Constitutional: Denies fever, malaise, fatigue, headache or abrupt weight changes.  HEENT: Denies eye pain, eye redness, ear pain, ringing in the ears, wax buildup, runny nose, nasal congestion, bloody nose, or sore throat. Respiratory: Denies difficulty breathing, shortness of  breath, cough or sputum production.   Cardiovascular: Patient reports intermittent swelling in legs.  Denies chest pain, chest tightness, palpitations or swelling in the hands.  Gastrointestinal: Denies abdominal pain, bloating, constipation, diarrhea or blood in the stool.  GU: Denies urgency, frequency, pain with urination, burning sensation, blood in urine, odor or discharge. Musculoskeletal: Patient reports back/knee pain.  Denies decrease in range of motion, difficulty with gait, muscle pain or joint swelling.  Skin: Patient reports dry skin.  Denies redness, lesions or ulcercations.  Neurological: Denies dizziness, difficulty with memory, difficulty with speech or problems with balance and coordination.  Psych: Denies anxiety, depression, SI/HI.  No other specific complaints in a complete review of systems (except as listed in HPI above).  Objective:   Physical Exam  BP 138/86   Ht 5' 7.5 (1.715 m)   Wt 283 lb (128.4 kg)   LMP 08/22/2024 (Approximate)   BMI 43.67 kg/m    Wt Readings from Last 3 Encounters:  07/18/24 288 lb 8 oz (130.9 kg)  02/29/24 279 lb (126.6 kg)  02/29/24 279 lb 9.6 oz (126.8 kg)    General: Appears her stated age, obese in NAD. Skin: Warm, dry and intact.  HEENT: Head: normal shape and size; Eyes: sclera white, no icterus, conjunctiva pink, PERRLA and EOMs intact;  Cardiovascular: Normal rate and rhythm. S1,S2 noted.  No murmur, rubs or gallops noted. No JVD or BLE edema.  Pulmonary/Chest: Normal effort and positive vesicular breath sounds. No respiratory distress. No wheezes, rales or ronchi noted.  Musculoskeletal:   No difficulty with gait.  Neurological: Alert and oriented. Coordination normal.       Latest Ref Rng & Units 08/30/2024   10:22 AM 08/29/2024    1:22 PM 02/29/2024   11:40 AM  CMP  Glucose 65 - 99 mg/dL 89  888  88   BUN 7 - 25 mg/dL 15  13  15    Creatinine 0.50 - 0.99 mg/dL 9.02  8.95  9.15   Sodium 135 - 146 mmol/L 139  140   139   Potassium 3.5 - 5.3 mmol/L 3.9  3.9  4.0   Chloride 98 - 110 mmol/L 101  102  102   CO2 20 - 32 mmol/L 30  29  28    Calcium 8.6 - 10.2 mg/dL 9.2  9.5  9.1   Total Protein 6.1 - 8.1 g/dL 7.0  7.2  6.8   Total Bilirubin 0.2 - 1.2 mg/dL 0.5  0.4  0.4   Alkaline Phos 38 - 126 U/L  63    AST 10 - 35 U/L 17  26  14    ALT 6 - 29 U/L 26  35  14    CBC    Component Value Date/Time   WBC 5.5 08/30/2024 1022   RBC 4.98 08/30/2024 1022   HGB 12.4 08/30/2024 1022   HGB 12.3 08/29/2024 1323   HCT 39.3 08/30/2024 1022   PLT 231 08/30/2024 1022   PLT 232 08/29/2024 1323   MCV 78.9 (L) 08/30/2024 1022   MCH 24.9 (L) 08/30/2024 1022   MCHC 31.6 08/30/2024 1022   RDW 14.7 08/30/2024 1022   LYMPHSABS 1.9 08/29/2024 1323   MONOABS 0.4 08/29/2024 1323   EOSABS 0.1 08/29/2024 1323   BASOSABS 0.0 08/29/2024 1323   Lipid Panel     Component Value Date/Time   CHOL 252 (H) 08/30/2024 1022   TRIG 101 08/30/2024 1022   HDL 56 08/30/2024 1022   CHOLHDL 4.5 08/30/2024 1022   LDLCALC 174 (H) 08/30/2024 1022   Lab Results  Component Value Date   HGBA1C 6.0 (H) 08/30/2024   HGBA1C 6.2 (H) 02/29/2024   HGBA1C 6.2 (H) 06/28/2023          Assessment & Plan:   Assessment and Plan    Hypertension, complicated by morbid obesity Blood pressure  improved but still above target. Current 140/88 mmHg, target 130/80 mmHg. Consistent medication adherence noted. - Increased lisinopril  HCT to two tablets daily (40-50 mg daily). - Reinforced DASH diet and exercise for weight loss - Will monitor  RTC in 5 months for your annual exam Angeline Laura, NP "

## 2024-10-16 ENCOUNTER — Ambulatory Visit: Admitting: Orthopedic Surgery

## 2024-11-07 ENCOUNTER — Ambulatory Visit: Admitting: Orthopedic Surgery

## 2024-11-08 ENCOUNTER — Ambulatory Visit: Admit: 2024-11-08 | Admitting: Gastroenterology

## 2025-03-05 ENCOUNTER — Encounter: Admitting: Internal Medicine

## 2025-08-29 ENCOUNTER — Inpatient Hospital Stay

## 2025-08-29 ENCOUNTER — Inpatient Hospital Stay: Admitting: Oncology
